# Patient Record
Sex: Male | Born: 1935 | Race: Black or African American | Hispanic: No | Marital: Single | Smoking: Former smoker
Health system: Southern US, Community
[De-identification: ages and names within clinical notes are randomized; demographics above are authoritative.]

## PROBLEM LIST (undated history)

## (undated) DIAGNOSIS — D649 Anemia, unspecified: Secondary | ICD-10-CM

## (undated) DIAGNOSIS — J449 Chronic obstructive pulmonary disease, unspecified: Secondary | ICD-10-CM

## (undated) DIAGNOSIS — I10 Essential (primary) hypertension: Secondary | ICD-10-CM

## (undated) DIAGNOSIS — K5792 Diverticulitis of intestine, part unspecified, without perforation or abscess without bleeding: Secondary | ICD-10-CM

## (undated) DIAGNOSIS — R918 Other nonspecific abnormal finding of lung field: Secondary | ICD-10-CM

## (undated) DIAGNOSIS — F0390 Unspecified dementia without behavioral disturbance: Secondary | ICD-10-CM

## (undated) HISTORY — PX: TONSILLECTOMY: SUR1361

## (undated) HISTORY — PX: SUBTOTAL COLECTOMY: SHX855

---

## 2007-05-13 ENCOUNTER — Inpatient Hospital Stay (HOSPITAL_COMMUNITY): Admission: EM | Admit: 2007-05-13 | Discharge: 2007-05-27 | Payer: Self-pay | Admitting: Emergency Medicine

## 2007-05-13 ENCOUNTER — Ambulatory Visit: Payer: Self-pay | Admitting: Cardiology

## 2007-05-13 ENCOUNTER — Ambulatory Visit: Payer: Self-pay | Admitting: Internal Medicine

## 2007-05-14 ENCOUNTER — Encounter (INDEPENDENT_AMBULATORY_CARE_PROVIDER_SITE_OTHER): Payer: Self-pay | Admitting: Internal Medicine

## 2007-05-20 ENCOUNTER — Encounter (INDEPENDENT_AMBULATORY_CARE_PROVIDER_SITE_OTHER): Payer: Self-pay | Admitting: General Surgery

## 2007-06-27 ENCOUNTER — Encounter: Admission: RE | Admit: 2007-06-27 | Discharge: 2007-06-27 | Payer: Self-pay | Admitting: Thoracic Surgery

## 2007-06-27 ENCOUNTER — Ambulatory Visit: Payer: Self-pay | Admitting: Thoracic Surgery

## 2010-07-31 ENCOUNTER — Encounter: Payer: Self-pay | Admitting: Thoracic Surgery

## 2010-11-22 NOTE — H&P (Signed)
Nathaniel Adkins, Nathaniel Adkins   MEDICAL RECORD NO.:  [id number]          PATIENT TYPE:  INP   LOCATION:  1823                         FACILITY:  [facility]   PHYSICIAN:  Zenaida Deed. Mayford Knife, M.D.DATE OF BIRTH:  [birth year]   DATE OF ADMISSION:  05/13/2007  DATE OF DISCHARGE:                              HISTORY & PHYSICAL   CHIEF COMPLAINT:  Rectal bleeding.   PRIMARY CARE PHYSICIAN:  [facility].   HISTORY OF PRESENT ILLNESS:  This is an 77 year old white male with only  past medical history significant for hypertension.  He reports that he  started 2 days ago with a large bright red bloody stool and filled up  the toilet.  That day, he also had an episode of syncope where he hit  his lip on the counter, but did not lose consciousness.  Since 2 days  ago, he reports that he has had about one-half dozen other similar large  bloody stools.  He also reports emesis x 1 without hematemesis as he has  been unable to keep down any food or drink.  He reports that he has had  to crawl around on the for the last 2 days secondary to severe dizziness  and weakness.  He was finally able to reach the phone today in order to  dial 911.  In addition to the above symptoms, he also endorses a bitter  taste in his mouth as well as pain between his shoulder blades which he  attributes to his gallbladder.  Upon further questioning, the patient  has a long history of intermittent constipation for which he takes stool  softeners as needed.  His usual bowel movement frequency is  approximately every other day.  He also reports that he has had a  history of similar rectal bleeding approximately 10 years ago which he  attributes to food poisoning.  He also reports a long history of dark  tarry stools.  He does not ever recall having a colonoscopy or endoscopy  in the past.   REVIEW OF SYSTEMS:  Please see HPI.  Review of systems is also pertinent  for shortness of  breath, but he denies any chest pain.   PAST MEDICAL HISTORY:  1. Hypertension.  2. Glaucoma.  3. No history of any hospitalizations and his last visit to a      physician was greater than 1 year ago at the [place].   PAST SURGICAL HISTORY:  Tonsillectomy performed in the 1950s.   HOME MEDICATIONS:  1. Hydrochlorothiazide 25 mg once daily.  2. Atenolol 25 mg once daily.  3. Lisinopril 40 mg daily.   SOCIAL HISTORY:  He moved from [place] to [place] 2 years ago.  He currently lives in [place],  [place] and his daughter,  Clarene Essex lives nearby.  He no longer drives and denies any history  of alcohol and tobacco use.   FAMILY HISTORY:  Noncontributory.   PHYSICAL EXAMINATION:  VITAL SIGNS:  Temperature 97, heart rate 115,  blood pressure 129/63, respirations 24, 95% on 4 liters  GENERAL:  The  patient is pleasant in no acute distress, alert and  oriented.  NECK:  No JVD.  HEENT:  Mucous membranes are dry.  CARDIOVASCULAR:  He is tachycardiac with normal S1 and S2 without any  murmur.  LUNGS:  Normal effort.  Clear to auscultation bilaterally.  ABDOMEN:  Bowel sounds are active.  Abdomen is soft, nontender,  nondistended and no masses are palpated.  RECTAL:  There is frank blood on the exterior of the anus.  Sphincter  tone is normal and there are no hemorrhoids evident.  EXTREMITIES:  No edema.   LABORATORY DATA:  Sodium 142, potassium 3.6, chloride 110, bicarb 24,  BUN 48, creatinine 1.54, glucose 177, INR is 1.2, hemoglobin 5.3.   IMPRESSION/PLAN:  This is an 77 year old white male with a history  significant only for hypertension.  He also likely has a long history of  gastrointestinal bleeding given his history of dark tarry stools.  He  presents with a 2 day history of acute bright red blood per rectum.  His  symptoms and labs are consistent with severe anemia.  1. Gastrointestinal:  Given his long history of dark tarry stools,      this may in fact be  an upper GI bleed that is progressing rapidly      such that he now has bright red blood per rectum.  He does not have      any abdominal pain to suggest diverticulitis and no hemorrhoids are      evident on exam.  We will obtain a GI consult for input as well as      evaluation with possible endoscopy and/or colonoscopy.  We will      start him on twice daily Protonix and maintain n.p.o. status.  2. Anemia:  He is currently receiving 1 unit of packed red blood cells      in the emergency department.  We will transfuse an additional 2      units and follow hemoglobin every 8 hours.  Given he is at      increased risk for myocardial infarction due to cardiovascular      stress, we will cycle cardiac enzymes and check an EKG in the      morning.  3. Dehydration:  We will bolus the patient with 1 liter of normal      saline and continue IV fluids at 175 mL per hour.  4. Hypertension:  We will hold the patient's home hypertensive      medications for now.  5. Acute renal failure:  Likely secondary to volume depletion.  We      will follow his creatinine after IV fluid supplementation.      Sylvan Cheese, M.D.  Electronically Signed      Zenaida Deed. Mayford Knife, M.D.  Electronically Signed    MJ/MEDQ  D:  05/13/2007  T:  05/14/2007  Job:  [id number]

## 2010-11-22 NOTE — Letter (Signed)
July 17, 2007   Lorne Skeens. Hoxworth, M.D.  [address], [address]  [place], [place] [address]   Re:  Nathaniel Adkins, Nathaniel Adkins                 DOB:  09/13/1919   Dear Romeo Apple:   I called the patient today because he had cancelled his PET scan.  He  refuses to have his PET scan, says he does not want any more treatment.  My nurse explained to the daughter that this is a very bad situation,  and that he probably has an early lung cancer which we can treat very  easily, and if he waits longer it will be non treatable.  I would  appreciate it if you see him in followup, that you would stress to him  that not continuing with his workup is detrimental to his care.   Ines Bloomer, M.D.  Electronically Signed   DPB/MEDQ  D:  07/17/2007  T:  07/17/2007  Job:  [id number]

## 2010-11-22 NOTE — Discharge Summary (Signed)
Nathaniel Adkins, Nathaniel Adkins               ACCOUNT NO.:  [id number]   MEDICAL RECORD NO.:  [id number]          PATIENT TYPE:  INP   LOCATION:  5531                         FACILITY:  [facility]   PHYSICIAN:  Zenaida Deed. Mayford Knife, M.D.DATE OF BIRTH:  1920/06/10   DATE OF ADMISSION:  05/13/2007  DATE OF DISCHARGE:  05/27/2007                               DISCHARGE SUMMARY   DISCHARGE DIAGNOSES:  1. Lower gastrointestinal bleed.  2. Anemia.  3. Dehydration.  4. Hypertension.  5. Acute renal failure.   CONSULTS:  GI ([facility] R[facility] Evens, M.D.), cardiology Christell Faith,  MD), surgery [facility] M. Derrell Lolling, M.D.), cardiothoracic surgery Ines Bloomer, M.D.).   PROCEDURES/STUDIES:  May 13, 2007 EGD:  Gastrointestinal bleed with  no upper GI source seen on endoscopy.  May 14, 2007 chest x-ray two  view:  Right mid lung zone nodule.  Recommend CT for further assessment.  COPD/emphysema.  May 14, 2007:  2D echo, left ventricular ejection  fraction estimated 65% to 70%.  No diagnostic evidence of left  ventricular regional wall motion abnormality, left ventricular wall  thickeners mildly increased.  Features were consistent with mild  diastolic dysfunction.  Aortic valve was mildly calcified.  There was  mild mitral annular calcification.  The left atrium was mildly dilated.  May 15, 2007 chest CT without contrast:  The nodular opacity  identified in the right mid lung on the recent chest x-ray represents a  soft tissue nodule at the cranial aspect of the right major fissure,  demonstrating subtle peripheral lobulation.  This may well be a small  bronchogenic neoplasm.  The nodule measured 9 mm on CT.  Bilateral  adrenal nodules also noted, but could not further be characterized.  May 15, 2007 colonoscopy:  Lower gastrointestinal bleed, almost  certainly diverticular bleed.  No active bleeding at this time.  May 17, 2007 mesenteric arteriogram:  No evidence of active  extravasation or focal lesion to suggestion a location of the patient's  GI bleed.  May 17, 2007 nuclear medicine GI bleeding scan:  Acute  gastrointestinal bleed localized to the small bowel within the right  upper quadrant and mostly likely the ileum.   DISCHARGE LABS:  White blood cell count 6.6, hemoglobin 8.9, hematocrit  26.3, platelets 300.  Sodium 140, potassium 4.2, chloride 111, bicarb  26, BUN 9, creatinine 1.13, glucose 112, albumin 1.8, calcium 7.9,  magnesium 2.1.   BRIEF HISTORY AND PHYSICAL:  An 77 year old Caucasian male with a past  medical history significant for hypertension and dark tarry stools  presented with a two day history of acute bright red blood per rectum  concerning for diverticular bleed.   HOSPITAL COURSE:  1. Lower gastrointestinal bleed:  The patient presented with a two day      history of nearly half a dozen large bright red bloody stools.      Admit hemoglobin and hematocrit were 5.3 and 15.4 respectively.      EGD at admit did not show signs of an upper GI bleed.  On hospital      day #3, a colonoscopy  was performed per GI recommendations and      demonstrated no active bleeding, but the impression was      diverticular bleed.  Continued episodes of bloody stool and consult      with vascular surgery resulted in mesenteric angiogram on hospital      day #5, which revealed no evidence of acute extravasation or focal      lesion to suggest the location of the bleed.  A nuclear bleeding      scan performed on that day demonstrated an acute GI bleed localized      to the small bowel within the right upper quadrant and mostly      likely the ileum.  However, consensus of GI and vascular surgeons      was that the primary source of the patient's bleed was likely      diverticular.  Intermittent mild bleed continued along with 2 to 4      unit transfusions on average to maintain hemoglobin and hematocrit,      and the patient was taken to OR on  hospital day #75 for a subtotal      colectomy with anastomosis.  There were no signs of small intestine      involvement during the procedure.  The patient tolerated the      procedure very well and had an uncomplicated postoperative course      with gradual return of regular p.o. intake and bowel movements.  No      episodes of rebleed occurred postoperatively.  The patient was      discharged on postoperative day #75  2. Anemia:  Hemoglobin and hematocrit were 5.3 and 15.4 respectively      on admit.  The patient received 17 total units of packed red blood      cells during the hospitalization.  The patient's hemoglobin and      hematocrit were stabilized and maintained as mentioned above.  No      transfusions were needed postoperatively.  Discharge hemoglobin and      hematocrit were 8.9 and 26.3 respectively.  The patient was      discharged on iron sulfate 325 mg p.o. b.i.d.  3. Dehydration:  The patient intravascularly depleted on admit, and      received two one liter boluses on day of admission.  Fluid status      was maintained with normal saline, IV fluids and p.o. intake as      tolerated.  4. Hypertension:  The patient was hypotensive when he presented to the      ED, so home blood pressure regimen was held.  As fluid status      improved and blood pressure returned to hypertensive levels,      metoprolol was gradually restarted.  The patient was discharged on      metoprolol 25 mg p.o. b.i.d. and lisinopril 10 mg one tablet p.o.      daily.  5. Acute renal failure:  The patient's creatinine was 1.54 on admit,      likely elevated secondary to dehydration on admission.  Serum      creatinine returned to 1.16 by hospital day #3 and fluid status was      maintained throughout the remainder of the hospitalization.  Serum      creatinine at discharge was 1.13.  6. Lung mass:  A 5-mm mass detected on admit chest x-ray.  The patient  was without pulmonary symptoms throughout  hospitalization, but has      a distance history of smoking and some residual COPD/emphysema.      Chest CT was performed on hospital day #3.  The nodular opacity in      the right mid lung was read to represent a soft tissue nodule at      the cranial aspect of the right major fissure, demonstrating subtle      peripheral lobulation.  Could not rule out small bronchogenic      neoplasm and recommended possible followup PET CT scan.  Bilateral      adrenal nodules were also noted, and PET CT can assess these      nodules as well if performed.  The patient was seen by      cardiothoracic surgery Ines Bloomer, M.D.) as a consult, and      patient was told to call to schedule a followup appointment at his      clinic for further evaluation and imaging.   DISCHARGE MEDICATIONS AND INSTRUCTIONS:  1. Lisinopril 10 mg one tablet p.o. daily.  2. Metoprolol 25 mg one tablet p.o. b.i.d.  3. Iron sulfate 325 mg p.o. b.i.d.  4. Prilosec 40 mg one tablet p.o. daily.  5. Vicodin 5/325 mg one to two tablets p.o. q.4h. p.r.n. pain (40      dispensed).  6. Home medications stopped:  HCTZ, atenolol.  7. Regular diet with no restriction.   FOLLOW UP:  1. The patient will call to schedule an appointment within two to      three weeks at [facility] with Dr. Johna Sheriff.  2. The patient will schedule appointment by this Friday at [facility]  3. The patient will call to schedule a follow up with Dr. Edwyna Shell to      evaluate lung nodule.   ISSUES FOR FOLLOWUP:  1. Followup patient's hemoglobin and hematocrit, and electrolytes.  2. Followup patient's hemoglobin and hematocrit.  3. Titrate blood pressure medications as necessary and check      appropriate electrolytes.  4. Complete lung nodule workup.  5. Follow surgical wound healing.     ______________________________  Durward Parcel. Mayford Knife, M.D.  Electronically Signed    JS/MEDQ  D:   05/27/2007  T:  05/27/2007  Job:  [id number]

## 2010-11-22 NOTE — Consult Note (Signed)
NAMEMANNIE, OHLIN NO.:  [id number]   MEDICAL RECORD NO.:  [id number]          PATIENT TYPE:  INP   LOCATION:  6729                         FACILITY:  [facility]   PHYSICIAN:  Christell Faith, MD   DATE OF BIRTH:  September 12, 1919   DATE OF CONSULTATION:  05/13/2007  DATE OF DISCHARGE:                                 CONSULTATION   REASON FOR CONSULTATION:  Positive troponin.   DIAGNOSIS:  Gastrointestinal bleed.   RESPONDING PHYSICIAN:  Dr. Charlton Haws with [facility].   CHIEF COMPLAINT:  Blood per rectum and syncope.   HISTORY OF PRESENT ILLNESS:  This is an 77 year old white man admitted  today with a three-day history of bright red blood per rectum.  He also  has a history of dark, tarry stools intermittently for several months  now.  Three days prior to admission, he had several consecutive bloody  stools followed by a syncopal event.  Since then, he has been extremely  weak, unable to walk secondary to lightheadedness, and continues to pass  bloody stools.  He finally presented to the emergency room today where  his hemoglobin was found to be 5.3.  He underwent an upper endoscopy  which was essentially negative.  He is now being transfused PRBCs and  the plan is for colonoscopy within the next day or two.  He continues to  have bloody stools.  His pre-syncope has improved, with no further  syncope.  Apparently on admission, he complained of some upper back pain  between his shoulder blades, but no chest pain, no shortness of breath.  He has no history of coronary disease, MI or congestive heart failure.  His troponin was checked and was found to be 0.6, creatinine kinase was  negative.   PAST MEDICAL HISTORY:  1. Left eye blindness.  This occurred acutely and is thought to      possibly be due to an embolization event.  2. Hypertension.  3. Glaucoma.   SOCIAL HISTORY:  He is a Cytogeneticist.  He lives alone in [place].  He  quit tobacco 30 years  ago.  He is a retired Naval architect.   FAMILY HISTORY:  Noncontributory in an 77 year old.   ALLERGIES:  NO KNOWN DRUG ALLERGIES.   MEDICATIONS:  Home medications:  1. Aspirin 325 mg p.o. daily.  2. Atenolol 25 mg p.o. daily.  3. Lisinopril 40 mg p.o. daily.  4. HCTZ 25 mg p.o. daily.   PHYSICAL EXAMINATION:  VITAL SIGNS:  Temperature of 98.7.  Heart rate  87.  Blood pressure 89/42.  Saturation 100% on two liters.  GENERAL:  This is a pleasant, older gentleman in no distress.  He is  awake, alert and oriented times three.  He has bruising on the left side  of his face and on his left arm secondary to his fall.  HEENT:  His neck veins are flat, no carotid bruits.  Mucous membranes  are moist.  No cervical adenopathy, no thyromegaly.  CARDIAC EXAM:  Regular rhythm, normal rate.  No murmurs or gallops.  RESPIRATORY:  Lungs clear  to auscultation bilaterally without wheezing  or rales.  ABDOMEN:  Soft, nontender, nondistended, normal bowel sounds.  EXTREMITIES:  No edema.  Extremities are positive for pallor, 2+  dorsalis pedis pulses bilaterally, 2+ femoral pulses with a right  femoral bruit.  NEUROLOGIC EXAM:  Nonfocal.  The patient is awake, alert and oriented  times three. No visual acuity left eye.   LABORATORY DATA:  Sodium 142, potassium 3.6, bicarbonate 24, BUN 48,  creatinine 1.5, glucose 177, white blood cells 13.8, hemoglobin 5.3,  platelets 174,000.  CK 145, CK-MB 7.2, troponin 0.67, INR 1.2, PTT 25,  AST 32, ALT 19, total bilirubin 0.8.  Electrocardiogram shows sinus  tachycardia with a rate of 112 with one premature atrial contraction.  There is a left anterior fascicular block and a very mild upsloping ST  depression.   IMPRESSION:  An 77 year old white man with no known cardiac history who  presents with syncope in the setting of a gastrointestinal bleed and  profound anemia.   PLAN:  1. The borderline troponin in this patient almost certainly represents       cardiac strain secondary to profound anemia.  In essence, the      treatment is blood transfusion as you are doing.  Without chest      pain or EKG changes, this troponin is nondiagnostic for acute      coronary syndrome.  In fact, I would only recommend checking      further cardiac enzymes if he develops a clinical picture      consistent with acute coronary syndrome.  2. Agree with continuing to hold aspirin, and would clearly not      anticoagulate this patient.  3. Agree with aggressive blood transfusion and volume resuscitation.      The patient is continuing to bleed and has borderline blood      pressure and would consider transfer to a higher level of care.  4. One possibility is the patient could have a cardiac source of      emboli with recent left eye blindness.  Should this diagnosis end      up being something along the lines of mesenteric ischemia, then      would definitely recommend pursuing heart monitoring to rule out      atrial fibrillation and an echo to r/o thrombus.   Thank you very much for this consult and we will continue to follow  along.      Christell Faith, MD  Electronically Signed     NDL/MEDQ  D:  05/13/2007  T:  05/14/2007  Job:  [id number]

## 2010-11-22 NOTE — Consult Note (Signed)
NAMECHENG, DEC               ACCOUNT NO.:  [id number]   MEDICAL RECORD NO.:  [id number]          PATIENT TYPE:  INP   LOCATION:  3303                         FACILITY:  [facility]   PHYSICIAN:  James L. Malon Kindle., M.D.DATE OF BIRTH:  [birth year]   DATE OF CONSULTATION:  05/13/2007  DATE OF DISCHARGE:                                 CONSULTATION   GASTROENTEROLOGY CONSULTATION:   REQUESTING PHYSICIAN:  Zenaida Deed. Mayford Knife, M.D., Family Practice Teaching  Service   REASON FOR CONSULTATION:  Gastrointestinal bleeding.   HISTORY:  An 77 year old gentleman who has previously gotten his care at  the [place] presented to the emergency room with bright red blood since  Saturday and had a syncopal episode, and his main reason for coming  apparently was syncope.  He had had some vomiting, vague upper abdominal  pain, back pain, noted dark stools ever since rectal bleeding.  He had  rectal bleeding only 1 time before when he had food poisoning many years  ago.  He was reported to be anemic at [facility] where he  went for glaucoma.  No heartburn, indigestion, etcetera.   CURRENT MEDICATIONS:  Daily aspirin, atenolol, hydrochlorothiazide,  lisinopril.  He is not taking any NSAIDs.   ALLERGIES:  NO KNOWN DRUG ALLERGIES.   PAST MEDICAL HISTORY:  Hypertension and glaucoma.  No previous  surgeries.   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   FAMILY HISTORY:  Negative for colon cancer.  He has had a brother with  prostate cancer.   PHYSICAL EXAM:  GENERAL:  The patient is hypotensive, appears slightly  confused, regarding words, quite pale.  HEART:  Regular rate and rhythm without murmurs or gallops.  LUNGS:  Clear.  ABDOMEN:  Soft, nontender, nondistended.  The patient had grossly normal  __________ .   PERTINENT LABS:  Hemoglobin 5.3.   ASSESSMENT:  Acute gastrointestinal bleed, sounds lower.  His  creatinine  is 1.5 and BUN is up at 48.  This could be an upper bleed, and we  probably ought to go ahead with an upper endoscopy first.   PLAN:  We will go ahead with an upper endoscopy today and proceed from  there.           ______________________________  Llana Aliment. Malon Kindle., M.D.     Waldron Session  D:  05/16/2007  T:  05/16/2007  Job:  [id number]

## 2010-11-22 NOTE — Op Note (Signed)
NAMETANVEER, BRAMMER NO.:  [id number]   MEDICAL RECORD NO.:  [id number]          PATIENT TYPE:  INP   LOCATION:  3303                         FACILITY:  [facility]   PHYSICIAN:  Sharlet Salina T. Hoxworth, M.D.DATE OF BIRTH:  1920-06-28   DATE OF PROCEDURE:  05/20/2007  DATE OF DISCHARGE:                               OPERATIVE REPORT   PREOPERATIVE DIAGNOSIS:  Lower GI bleeding, probably secondary to  colonic diverticulosis.   POSTOPERATIVE DIAGNOSIS:  Lower GI bleeding, probably secondary to  colonic diverticulosis.   SURGICAL PROCEDURES:  Subtotal colectomy with anastomosis.   SURGEON:  Lorne Skeens. Hoxworth, M.D.   ASSISTANT:  Cherylynn Ridges, M.D.   ANESTHESIA:  General.   BRIEF HISTORY:  Mr. Radziewicz is an 77 year old male who presents with  persistent lower GI bleeding, intermittent over the last 10 days, but  requiring a total of 17 units of transfusion.  He has had workup  including normal upper endoscopy, lower endoscopy showing pan  diverticulosis with blood throughout the colon.  He had a bleeding scan  that suggested bleeding in the distal small bowel.  Angiography was  negative.  He continues to have episodic significant bleeding and today  hemoglobin fell 2 grams to 7.5 with bright red bloody bowel movements  last night.  Overall, the picture appears most consistent with  diverticular bleed.  After consultation with GI and concurring with the  opinions of my partners following the patient last week, I have  recommended proceeding with a subtotal colectomy in an effort to control  his bleeding.  Nature of procedure, indications, risks of bleeding,  infection, anesthetic risk, cardiopulmonary complications, anastomotic  leak and failure to control his bleeding have been discussed and  understood with the patient and his daughter.  He is now brought to the  operating room for this procedure.   DESCRIPTION OF OPERATION:  The patient is brought to the  operating room,  placed in the supine position on the operating table and general  endotracheal anesthesia was induced.  He received preoperative  antibiotics.  PAS were placed.  The abdomen was widely sterilely prepped  and draped.  Correct patient and procedure were verified.  The abdomen  was explored through a midline incision skirting the umbilicus.  There  was noted to be pan colonic diverticulosis with large ticks, some of  which appeared to contain clot.  There was blood throughout the large  bowel.  The small bowel was carefully examined and there were no  abnormalities detected, specifically no evidence of angiomas, no  palpable masses and there was no blood in the terminal ileum.  This  picture appeared most consistent as thought preop with colonic  diverticular bleed and we elected to proceed with the subtotal  colectomy.  Also noted, the liver was normal.  Stomach and duodenum  appeared normal.  The gallbladder was somewhat distended but no palpable  stones or inflammation.  The colon was then extensively mobilized,  dividing lateral peritoneal attachments along the cecum and right colon,  mobilizing this up out of the retroperitoneum.  The duodenum was  identified and carefully protected.  The omentum was dissected up off  the transverse colon.  The lesser sac entered and the transverse colon  completely mobilized.  There was very redundant sigmoid colon.  The  sigmoid and left colon were mobilized dividing lateral peritoneal  attachments.  Finally, the splenic flexure was taken down using the  Harmonic scalpel and the colon completely mobilized.  At this point  there was noted to be a small capsular tear in the inferior pole of the  spleen with some bleeding.  This was completely controlled with Surgicel  and FloSeal.  The terminal ileum was then divided a few centimeters from  ileocecal valve with the GIA stapler.  The mesentery of the entire colon  was then sequentially  divided with the LigaSure device, although larger  vessels were additionally clamped and tied with 2-0 silk ties.  The  dissection progressed around to the sigmoid and at the distal sigmoid an  area of resection was chosen where the tinea ended and there were no  further ticks seen.  This was at the distal sigmoid colon.  This was  divided with the GIA stapler and the specimen removed.  The abdomen was  again inspected for hemostasis and complete hemostasis was obtained.  Anastomosis was then performed between the terminal ileum and the distal  sigmoid with side-to-side anastomosis using the GIA stapler.  The common  enterotomy was then closed with running 3-0 chromic and seromuscular 2-0  silks.  Abdomen was irrigated and again hemostasis assured.  The  mesenteric defect at the anastomosis was closed with interrupted silks.  The viscera returned to the anatomic position.  The midline fascia was  closed with running #1 PDS begun at either end of the incision and tied  centrally.  The subcu was irrigated and skin closed with staples.  Sponge, needle and instrument counts were correct.  The patient was  taken to the recovery room in stable condition.      Lorne Skeens. Hoxworth, M.D.  Electronically Signed     BTH/MEDQ  D:  05/20/2007  T:  05/21/2007  Job:  [id number]

## 2010-11-22 NOTE — Letter (Signed)
June 27, 2007   Lorne Skeens. Hoxworth, M.D.  [address], [address]  [place], [place] [address]   Re:  HERNDON, GRILL                 DOB:  [birth year]   Dear Romeo Apple,   I saw Mr. Salvato back for followup with his right lower lobe lesion.  Chest x-ray showed no significant change.  He is recovering well after  his subtotal colectomy and says he is doing well.  Apparently he saw you  yesterday.   His lungs are clear to auscultation and percussion.  Heart:  Regular  sinus rhythm.   Since there has been no change and this could possibly be an early lung  cancer, I plan to get a PET scan on it in early January and see him back  when he is further out from his colon surgery.   His blood pressure was 172/76, pulse 67, respirations 18, sats were 98%.   Ines Bloomer, M.D.  Electronically Signed   DPB/MEDQ  D:  06/27/2007  T:  06/28/2007  Job:  [id number]

## 2010-11-22 NOTE — Op Note (Signed)
NAMEKERT, SHACKETT               ACCOUNT NO.:  [id number]   MEDICAL RECORD NO.:  [id number]          PATIENT TYPE:  INP   LOCATION:  1823                         FACILITY:  [facility]   PHYSICIAN:  James L. Malon Kindle., M.D.DATE OF BIRTH:  14-Jan-1920   DATE OF PROCEDURE:  05/13/2007  DATE OF DISCHARGE:                               OPERATIVE REPORT   PROCEDURE:  Esophagogastroduodenoscopy.   MEDICATIONS:  Hurricaine spray, fentanyl 35 mcg, Versed 3.5 mg IV.   INDICATION:  An 77 year old who presented with a subacute GI bleed with  an elevated BUN, a hemoglobin of 5.5.  He has received some transfusion.  This was done in an attempt to identify an upper GI source.   DESCRIPTION OF PROCEDURE:  The procedure was explained to the patient  and consent obtained.  The patient in the left lateral decubitus  position.  The Pentax upper endoscope was inserted and advanced.  The  stomach was entered.  Pylorus identified and passed.  The patient had a  somewhat large stomach, but there was no signs of active bleeding or  clots.  The duodenum was seen well down to the second portion of  ulceration, no sign of current or recent bleeding.  Scope was withdrawn  back in the stomach.  The pyloric channel was normal.  Fundus and cardia  seen on the retroflexed view and was normal.  The scope was withdrawn.  The distal and proximal esophagus revealed no evidence of varices.  Scope withdrawn.  The patient tolerated the procedure well.   ASSESSMENT:  Gastrointestinal bleed with no upper GI source seen on  endoscopy.   PLAN:  Will keep the patient on clear liquids, transfuse and probably do  colonoscopy in the next 1-2 days if the patient is agreeable.           ______________________________  Llana Aliment. Malon Kindle., M.D.     Waldron Session  D:  05/13/2007  T:  05/14/2007  Job:  [id number]   cc:   Raynelle Fanning A. Mayford Knife, M.D.

## 2010-11-22 NOTE — Consult Note (Signed)
NAMEAUTREY, HUMAN NO.:  [id number]   MEDICAL RECORD NO.:  [id number]          PATIENT TYPE:  INP   LOCATION:  3303                         FACILITY:  [facility]   PHYSICIAN:  Angelia Mould. Derrell Lolling, M.D.DATE OF BIRTH:  08/30/1919   DATE OF CONSULTATION:  DATE OF DISCHARGE:                                 CONSULTATION   GASTROENTEROLOGY CONSULT:  Dr. Randa Evens   CONSULTING SURGEON:  Dr. Derrell Lolling   REASON FOR CONSULTATION:  Bright red blood per rectum and possible lower  GI bleed.   HISTORY OF PRESENT ILLNESS:  This is an 77 year old white male with a  history of hypertension, who presented to the ER on November 3 with  rectal bleeding.  He states that he has had this for two days prior to  his presentation to the ER.  At home, he had multiple, large bloody  bowel movements that were all bright red blood.  He had a syncopal  episode at home along with some dizziness and weakness.  On admission,  his hemoglobin was 5.5.  GI was consulted and they performed an  endoscopy which was normal and a colonoscopy which showed large amounts  of old blood and clots and perfuse diverticular disease throughout the  entire colon.  Due to all the old blood, the clots and the diverticular  disease, a definitive bleeding point was not able to be seen.  Since  admission, he has had numerous units of blood transfused.  He is also  Hemoccult-positive.  As of today, he is continuing to have large bloody  bowel movements and; therefore, we were consulted.   REVIEW OF SYSTEMS:  See HPI.  He admits to having bright red blood per  rectum, weakness and kind of trembly and also intermittent abdominal  pain.  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  All other systems  are negative.   PAST MEDICAL HISTORY:  1. Hypertension.  2. Glaucoma.   PAST SURGICAL HISTORY:  He had tonsillectomy in the 1950's.   SOCIAL HISTORY:  He is currently living by himself and moved from  [place] to  [place] to be closer to his daughter.  He does have a  history of smoking, but he quit more than 30 years ago.  He denies any  use of alcohol or other drugs.   PHYSICAL EXAMINATION:  GENERAL:  This is an 77 year old white male who  is pleasant in no acute distress, but is somewhat weak and unsteady,  currently with no fevers or chills.  VITALS:  Temperature 98.3, pulse 71, respirations 18, blood pressure  133/60.  HEENT:  Patient is wearing glasses, otherwise his head is normocephalic,  atraumatic.  Sclerae non-injected.  NECK:  Supple with no lymphadenopathy.  No thyromegaly present.  CHEST:  Clear to auscultation bilaterally with no wheezes, rhonchi or  rales heard.  HEART:  Regular rate and rhythm with no murmurs, gallops or rubs heard.  ABDOMEN:  Soft, distended, nontender with hyperactive bowel sounds.  RECTAL EXAM:  Deferred due to the previous colonoscopy as well as frank  blood  per rectum.  EXTREMITIES:  He is moving all four extremities.  No cyanosis, clubbing  or edema is noted.  NEURO:  He is alert and oriented x3, moving all four extremities with no  focal deficits noted.   LABS AND DIAGNOSTICS:  White blood cell 8,100.  Current hemoglobin is  7.5.  Sodium is 140, potassium 3.4, CO2 26, BUN/creatinine is 21 and  1.16.  Cardiac panel done on November 4 shows a CK of 139, a CK-MB of  4.9 and a troponin of 1.28.   Endoscopy was normal with no upper GI bleed seen.   Colonoscopy shows large amounts of old blood, clots and diffuse pan  diverticular disease with no definitive point of bleeding visualized.   IMPRESSION:  1. Lower GU bleed.  Colonic source likely. Small bowel source possible      but less likely.  2. Acute blood loss anemia which is secondary to his GI bleeding.  3. Hypertension.  4. Acute renal failure which seems to have improved with a current BUN      of 21 and creatinine of 1.16.   PLAN:  1. Immediate red blood cell nuclear medicine scan. We will  follow with      these results.  2. NPO for possibility of surgery.  3. If point of bleeding is determined on the nuclear scan, I would      favor immediate angiography to confirm the site of  bleeding and an      attempt at angioembolization.  4. If no site of bleeding can be demonstrated and he continues to      bleed, we may have to consider a subtotal colectomy.  5. If no site of bleeding is identified and he stops bleeding, will      observe.   Further recommendations will be per Dr. Derrell Lolling after his examination of  the patient.      Letha Cape, PA      Jennings. Derrell Lolling, M.D.  Electronically Signed    KEO/MEDQ  D:  05/17/2007  T:  05/17/2007  Job:  [id number]

## 2010-11-22 NOTE — Op Note (Signed)
Nathaniel Adkins, Nathaniel Adkins               ACCOUNT NO.:  [id number]   MEDICAL RECORD NO.:  [id number]          PATIENT TYPE:  INP   LOCATION:  2852                         FACILITY:  [facility]   PHYSICIAN:  James L. Malon Kindle., M.D.DATE OF BIRTH:  1920-01-12   DATE OF PROCEDURE:  05/15/2007  DATE OF DISCHARGE:                               OPERATIVE REPORT   PROCEDURE:  Colonoscopy.   MEDICATIONS:  1. Fentanyl 50 mcg.  2. Versed 2 mg IV.   INDICATION:  Patient with acute GI bleed with negative upper endoscopy,  had bright red blood during the prep.   DESCRIPTION OF PROCEDURE:  Procedure explained to the patient and  consent obtained.  With the patient in the left lateral decubitus  position, the pediatric Pentax scope was inserted and advanced.  The  patient had a large amount of old blood and clots in the colon, profuse  diverticular disease with multiple large and small diverticula  throughout the entire colon.  We were able to advance over into what was  felt to be the cecum.  There were so many diverticula it was hard to  tell what could have been the appendiceal orifice, and the ileocecal  valve was not clearly seen.  There was no active bleeding there.  The  scope was withdrawn.  The colonic mucosa vigorously irrigated, lots of  old blood and coffee-ground material, no bright red blood and no active  bleeding, profuse diverticulosis throughout.  No real hemorrhoids seen  in the rectum in retroflex view.  The scope was withdrawn.  The patient  tolerated the procedure well, had good vital signs throughout.   ASSESSMENT:  Lower gastrointestinal bleed, almost certainly diverticular  bleed.  No active bleeding at this time.   PLAN:  Will transfer the patient to step-down, keep on clear liquids and  MiraLax.  He would likely need an angiogram with attempted embolization  if he continues to bleed.           ______________________________  Llana Aliment Malon Kindle., M.D.     Nathaniel Adkins   D:  05/15/2007  T:  05/16/2007  Job:  [id number]   cc:   Raynelle Fanning A. Mayford Knife, M.D.

## 2011-04-18 LAB — CROSSMATCH
ABO/RH(D): A POS
ABO/RH(D): A POS
Antibody Screen: NEGATIVE
Antibody Screen: NEGATIVE

## 2011-04-18 LAB — BASIC METABOLIC PANEL
BUN: 8
BUN: 10
BUN: 14
BUN: 42 — ABNORMAL HIGH
BUN: 6
BUN: 9
CO2: 24
CO2: 25
CO2: 25
CO2: 26
CO2: 26
Calcium: 7.7 — ABNORMAL LOW
Calcium: 8 — ABNORMAL LOW
Calcium: 7 — ABNORMAL LOW
Calcium: 7.3 — ABNORMAL LOW
Calcium: 7.4 — ABNORMAL LOW
Calcium: 8.1 — ABNORMAL LOW
Chloride: 109
Chloride: 110
Chloride: 108
Chloride: 109
Chloride: 114 — ABNORMAL HIGH
Creatinine, Ser: 1.04
Creatinine, Ser: 1.17
Creatinine, Ser: 0.97
Creatinine, Ser: 1.07
Creatinine, Ser: 1.13
Creatinine, Ser: 1.16
Creatinine, Ser: 1.27
GFR calc Af Amer: >60
GFR calc Af Amer: >60
GFR calc Af Amer: >60
GFR calc Af Amer: >60
GFR calc Af Amer: >60
GFR calc non Af Amer: 59 — ABNORMAL LOW
GFR calc non Af Amer: 54 — ABNORMAL LOW
GFR calc non Af Amer: 60 — ABNORMAL LOW
GFR calc non Af Amer: >60
GFR calc non Af Amer: >60
GFR calc non Af Amer: >60
GFR calc non Af Amer: >60
Glucose, Bld: 110 — ABNORMAL HIGH
Glucose, Bld: 111 — ABNORMAL HIGH
Glucose, Bld: 122 — ABNORMAL HIGH
Glucose, Bld: 97
Potassium: 3.3 — ABNORMAL LOW
Potassium: 3.2 — ABNORMAL LOW
Potassium: 3.3 — ABNORMAL LOW
Potassium: 3.7
Potassium: 3.7
Potassium: 4.3
Sodium: 138
Sodium: 140
Sodium: 138
Sodium: 140

## 2011-04-18 LAB — ABO/RH: ABO/RH(D): A POS

## 2011-04-18 LAB — COMPREHENSIVE METABOLIC PANEL
ALT: 29
AST: 28
AST: 19
AST: 32
Albumin: 1.8 — ABNORMAL LOW
Albumin: 1.8 — ABNORMAL LOW
Alkaline Phosphatase: 38 — ABNORMAL LOW
BUN: 48 — ABNORMAL HIGH
CO2: 24
Calcium: 7.5 — ABNORMAL LOW
Chloride: 110
Chloride: 112
Creatinine, Ser: 1.13
Creatinine, Ser: 1.1
Creatinine, Ser: 1.54 — ABNORMAL HIGH
GFR calc Af Amer: >60
GFR calc Af Amer: >60
GFR calc non Af Amer: >60
Glucose, Bld: 112 — ABNORMAL HIGH
Glucose, Bld: 177 — ABNORMAL HIGH
Potassium: 3.6
Sodium: 142
Total Bilirubin: 0.6
Total Protein: 4.1 — ABNORMAL LOW
Total Protein: 3.9 — ABNORMAL LOW
Total Protein: 4.5 — ABNORMAL LOW

## 2011-04-18 LAB — CBC
HCT: 15.4 — ABNORMAL LOW
HCT: 21.1 — ABNORMAL LOW
HCT: 21.8 — ABNORMAL LOW
HCT: 22.2 — ABNORMAL LOW
HCT: 23.3 — ABNORMAL LOW
HCT: 24.9 — ABNORMAL LOW
HCT: 26.3 — ABNORMAL LOW
HCT: 26.8 — ABNORMAL LOW
HCT: 27.1 — ABNORMAL LOW
HCT: 32 — ABNORMAL LOW
HCT: 35.7 — ABNORMAL LOW
Hemoglobin: 8.7 — ABNORMAL LOW
Hemoglobin: 10.6 — ABNORMAL LOW
Hemoglobin: 12.2 — ABNORMAL LOW
Hemoglobin: 7.5 — CL
Hemoglobin: 7.9 — CL
Hemoglobin: 8.4 — ABNORMAL LOW
Hemoglobin: 8.6 — ABNORMAL LOW
Hemoglobin: 9 — ABNORMAL LOW
MCHC: 33.3
MCHC: 33.5
MCHC: 33.9
MCHC: 33.8
MCHC: 33.9
MCHC: 33.9
MCHC: 34
MCHC: 34
MCHC: 34
MCHC: 34.1
MCHC: 34.2
MCHC: 34.4
MCHC: 34.9
MCHC: 35
MCV: 88.8
MCV: 90.4
MCV: 82.8
MCV: 86.6
MCV: 86.9
MCV: 87.3
MCV: 87.5
MCV: 89.1
MCV: 89.9
MCV: 90.6
MCV: 90.9
Platelets: 290
Platelets: 300
Platelets: 120 — ABNORMAL LOW
Platelets: 130 — ABNORMAL LOW
Platelets: 134 — ABNORMAL LOW
Platelets: 144 — ABNORMAL LOW
Platelets: 146 — ABNORMAL LOW
Platelets: 163
Platelets: 168
Platelets: 174
Platelets: 198
Platelets: 210
Platelets: 211
Platelets: 85 — ABNORMAL LOW
RBC: 3.07 — ABNORMAL LOW
RBC: 1.78 — ABNORMAL LOW
RBC: 2.42 — ABNORMAL LOW
RBC: 2.51 — ABNORMAL LOW
RBC: 2.67 — ABNORMAL LOW
RBC: 2.67 — ABNORMAL LOW
RBC: 2.92 — ABNORMAL LOW
RBC: 3.04 — ABNORMAL LOW
RBC: 3.06 — ABNORMAL LOW
RDW: 16.3 — ABNORMAL HIGH
RDW: 14.4 — ABNORMAL HIGH
RDW: 15 — ABNORMAL HIGH
RDW: 15.8 — ABNORMAL HIGH
RDW: 16 — ABNORMAL HIGH
RDW: 16.3 — ABNORMAL HIGH
RDW: 16.3 — ABNORMAL HIGH
RDW: 16.5 — ABNORMAL HIGH
RDW: 16.6 — ABNORMAL HIGH
RDW: 16.6 — ABNORMAL HIGH
RDW: 16.8 — ABNORMAL HIGH
RDW: 16.8 — ABNORMAL HIGH
RDW: 17 — ABNORMAL HIGH
WBC: 7.7
WBC: 7.9
WBC: 10.1
WBC: 10.2
WBC: 10.3
WBC: 11.9 — ABNORMAL HIGH
WBC: 8.1
WBC: 8.6
WBC: 8.6
WBC: 9.4
WBC: 9.6

## 2011-04-18 LAB — HEMOGLOBIN AND HEMATOCRIT, BLOOD
HCT: 21.3 — ABNORMAL LOW
HCT: 26 — ABNORMAL LOW
HCT: 28.8 — ABNORMAL LOW
Hemoglobin: 10.2 — ABNORMAL LOW
Hemoglobin: 7.3 — CL
Hemoglobin: 8.1 — ABNORMAL LOW
Hemoglobin: 9 — ABNORMAL LOW
Hemoglobin: 9.3 — ABNORMAL LOW
Hemoglobin: 9.7 — ABNORMAL LOW

## 2011-04-18 LAB — CARDIAC PANEL(CRET KIN+CKTOT+MB+TROPI)
CK, MB: 4.9 — ABNORMAL HIGH
Relative Index: 3.4 — ABNORMAL HIGH
Total CK: 139
Troponin I: 1.28
Troponin I: 1.3

## 2011-04-18 LAB — PREPARE RBC (CROSSMATCH)

## 2011-04-18 LAB — APTT: aPTT: 28

## 2011-04-18 LAB — DIFFERENTIAL
Basophils Absolute: 0
Eosinophils Absolute: 0
Monocytes Relative: 6
Neutrophils Relative %: 82 — ABNORMAL HIGH

## 2011-04-18 LAB — OCCULT BLOOD X 1 CARD TO LAB, STOOL: Fecal Occult Bld: POSITIVE

## 2011-04-18 LAB — TROPONIN I: Troponin I: 0.67

## 2011-04-18 LAB — PROTIME-INR: INR: 1.2

## 2011-04-18 LAB — HEMOGLOBIN: Hemoglobin: 7.5 — CL

## 2011-04-18 LAB — MAGNESIUM: Magnesium: 2.1

## 2011-05-26 ENCOUNTER — Emergency Department (HOSPITAL_COMMUNITY): Payer: Medicare Other

## 2011-05-26 ENCOUNTER — Inpatient Hospital Stay (HOSPITAL_COMMUNITY)
Admission: EM | Admit: 2011-05-26 | Discharge: 2011-06-01 | DRG: 418 | Disposition: A | Discharge status: Home / Self Care | Payer: Medicare Other | Attending: Internal Medicine | Admitting: Internal Medicine

## 2011-05-26 DIAGNOSIS — J449 Chronic obstructive pulmonary disease, unspecified: Secondary | ICD-10-CM | POA: Diagnosis present

## 2011-05-26 DIAGNOSIS — K810 Acute cholecystitis: Secondary | ICD-10-CM | POA: Diagnosis present

## 2011-05-26 DIAGNOSIS — K8040 Calculus of bile duct with cholecystitis, unspecified, without obstruction: Secondary | ICD-10-CM | POA: Diagnosis present

## 2011-05-26 DIAGNOSIS — R509 Fever, unspecified: Secondary | ICD-10-CM | POA: Diagnosis present

## 2011-05-26 DIAGNOSIS — D696 Thrombocytopenia, unspecified: Secondary | ICD-10-CM | POA: Diagnosis present

## 2011-05-26 DIAGNOSIS — I10 Essential (primary) hypertension: Secondary | ICD-10-CM | POA: Diagnosis present

## 2011-05-26 DIAGNOSIS — R222 Localized swelling, mass and lump, trunk: Secondary | ICD-10-CM | POA: Diagnosis present

## 2011-05-26 DIAGNOSIS — K8042 Calculus of bile duct with acute cholecystitis without obstruction: Principal | ICD-10-CM | POA: Diagnosis present

## 2011-05-26 DIAGNOSIS — K8050 Calculus of bile duct without cholangitis or cholecystitis without obstruction: Secondary | ICD-10-CM

## 2011-05-26 DIAGNOSIS — D72829 Elevated white blood cell count, unspecified: Secondary | ICD-10-CM | POA: Diagnosis present

## 2011-05-26 DIAGNOSIS — D649 Anemia, unspecified: Secondary | ICD-10-CM | POA: Diagnosis present

## 2011-05-26 DIAGNOSIS — Z79899 Other long term (current) drug therapy: Secondary | ICD-10-CM

## 2011-05-26 DIAGNOSIS — R17 Unspecified jaundice: Secondary | ICD-10-CM | POA: Diagnosis present

## 2011-05-26 DIAGNOSIS — Z91199 Patient's noncompliance with other medical treatment and regimen due to unspecified reason: Secondary | ICD-10-CM

## 2011-05-26 DIAGNOSIS — E876 Hypokalemia: Secondary | ICD-10-CM | POA: Diagnosis present

## 2011-05-26 DIAGNOSIS — K819 Cholecystitis, unspecified: Secondary | ICD-10-CM

## 2011-05-26 DIAGNOSIS — R1011 Right upper quadrant pain: Secondary | ICD-10-CM | POA: Diagnosis present

## 2011-05-26 DIAGNOSIS — H409 Unspecified glaucoma: Secondary | ICD-10-CM | POA: Diagnosis present

## 2011-05-26 DIAGNOSIS — Z87891 Personal history of nicotine dependence: Secondary | ICD-10-CM

## 2011-05-26 DIAGNOSIS — J4489 Other specified chronic obstructive pulmonary disease: Secondary | ICD-10-CM | POA: Diagnosis present

## 2011-05-26 DIAGNOSIS — Z9119 Patient's noncompliance with other medical treatment and regimen: Secondary | ICD-10-CM

## 2011-05-26 DIAGNOSIS — J90 Pleural effusion, not elsewhere classified: Secondary | ICD-10-CM | POA: Diagnosis present

## 2011-05-26 DIAGNOSIS — N390 Urinary tract infection, site not specified: Secondary | ICD-10-CM | POA: Diagnosis present

## 2011-05-26 DIAGNOSIS — H9190 Unspecified hearing loss, unspecified ear: Secondary | ICD-10-CM | POA: Diagnosis present

## 2011-05-26 HISTORY — DX: Chronic obstructive pulmonary disease, unspecified: J44.9

## 2011-05-26 HISTORY — DX: Other nonspecific abnormal finding of lung field: R91.8

## 2011-05-26 HISTORY — DX: Diverticulitis of intestine, part unspecified, without perforation or abscess without bleeding: K57.92

## 2011-05-26 HISTORY — DX: Anemia, unspecified: D64.9

## 2011-05-26 HISTORY — DX: Essential (primary) hypertension: I10

## 2011-05-26 LAB — URINALYSIS, ROUTINE W REFLEX MICROSCOPIC
Glucose, UA: NEGATIVE mg/dL
Hgb urine dipstick: NEGATIVE
Ketones, ur: 15 mg/dL — AB
Protein, ur: 100 mg/dL — AB

## 2011-05-26 LAB — DIFFERENTIAL
Basophils Relative: 0 % (ref 0–1)
Eosinophils Relative: 0 % (ref 0–5)
Monocytes Absolute: 0.9 10*3/uL (ref 0.1–1.0)
Monocytes Relative: 6 % (ref 3–12)
Neutro Abs: 13.9 10*3/uL — ABNORMAL HIGH (ref 1.7–7.7)

## 2011-05-26 LAB — COMPREHENSIVE METABOLIC PANEL
Albumin: 3.3 g/dL — ABNORMAL LOW (ref 3.5–5.2)
BUN: 24 mg/dL — ABNORMAL HIGH (ref 6–23)
Calcium: 8.9 mg/dL (ref 8.4–10.5)
Chloride: 100 mEq/L (ref 96–112)
Creatinine, Ser: 1.17 mg/dL (ref 0.50–1.35)
GFR calc non Af Amer: 53 mL/min — ABNORMAL LOW (ref >90)
Total Bilirubin: 4.9 mg/dL — ABNORMAL HIGH (ref 0.3–1.2)

## 2011-05-26 LAB — CBC
HCT: 34.4 % — ABNORMAL LOW (ref 39.0–52.0)
Hemoglobin: 11.6 g/dL — ABNORMAL LOW (ref 13.0–17.0)
MCH: 27.2 pg (ref 26.0–34.0)
MCHC: 33.7 g/dL (ref 30.0–36.0)
MCV: 80.6 fL (ref 78.0–100.0)

## 2011-05-26 LAB — URINE MICROSCOPIC-ADD ON

## 2011-05-26 LAB — LIPASE, BLOOD: Lipase: 19 U/L (ref 11–59)

## 2011-05-26 MED ORDER — HYDROMORPHONE HCL PF 1 MG/ML IJ SOLN
0.5000 mg | INTRAMUSCULAR | Status: DC | PRN
  Administered 2011-05-27: 0.5 mg via INTRAVENOUS
  Filled 2011-05-26: qty 1

## 2011-05-26 MED ORDER — ACETAMINOPHEN 325 MG PO TABS
650.0000 mg | ORAL_TABLET | Freq: Four times a day (QID) | ORAL | Status: DC | PRN
  Administered 2011-05-27: 650 mg via ORAL
  Filled 2011-05-26: qty 2

## 2011-05-26 MED ORDER — ONDANSETRON HCL 4 MG PO TABS: 4.0000 mg | ORAL_TABLET | Freq: Four times a day (QID) | ORAL | Status: DC | PRN

## 2011-05-26 MED ORDER — PIPERACILLIN-TAZOBACTAM 3.375 G IVPB
3.3750 g | Freq: Once | INTRAVENOUS | Status: AC
  Administered 2011-05-26: 3.375 g via INTRAVENOUS
  Filled 2011-05-26: qty 50

## 2011-05-26 MED ORDER — DEXTROSE 5 % IV SOLN
1.0000 g | Freq: Once | INTRAVENOUS | Status: AC
  Administered 2011-05-26: 1 g via INTRAVENOUS
  Filled 2011-05-26: qty 10

## 2011-05-26 MED ORDER — POTASSIUM CHLORIDE IN NACL 20-0.9 MEQ/L-% IV SOLN
INTRAVENOUS | Status: DC
  Administered 2011-05-26 – 2011-05-27 (×2): via INTRAVENOUS
  Filled 2011-05-26 (×2): qty 1000

## 2011-05-26 MED ORDER — SODIUM CHLORIDE 0.9 % IV SOLN
INTRAVENOUS | Status: DC
  Administered 2011-05-26 (×2): via INTRAVENOUS

## 2011-05-26 MED ORDER — ACETAMINOPHEN 650 MG RE SUPP: 650.0000 mg | Freq: Four times a day (QID) | RECTAL | Status: DC | PRN

## 2011-05-26 MED ORDER — ONDANSETRON HCL 4 MG/2ML IJ SOLN: 4.0000 mg | Freq: Four times a day (QID) | INTRAMUSCULAR | Status: DC | PRN

## 2011-05-26 MED ORDER — PIPERACILLIN-TAZOBACTAM 3.375 G IVPB
3.3750 g | Freq: Three times a day (TID) | INTRAVENOUS | Status: DC
  Administered 2011-05-27 – 2011-06-01 (×16): 3.375 g via INTRAVENOUS
  Filled 2011-05-26 (×17): qty 50

## 2011-05-26 MED ORDER — PANTOPRAZOLE SODIUM 40 MG IV SOLR
40.0000 mg | Freq: Every day | INTRAVENOUS | Status: DC
  Administered 2011-05-27 – 2011-05-31 (×6): 40 mg via INTRAVENOUS
  Filled 2011-05-26 (×9): qty 40

## 2011-05-26 NOTE — ED Notes (Signed)
Patient is resting comfortably. 

## 2011-05-26 NOTE — ED Notes (Signed)
Family at bedside. 

## 2011-05-26 NOTE — ED Notes (Signed)
Patient presents with right flank pain with radiation to right groin x 3 weeks with worsening pain today. Patient reports he's passed 4 stones in the past 3 weeks and recently has had hematuria.

## 2011-05-26 NOTE — ED Notes (Signed)
Zosyn was hung, then stopped due to receiving orders for blood cultures.

## 2011-05-26 NOTE — ED Provider Notes (Signed)
History     CSN: [id number] Arrival date & time: 05/26/2011 12:55 PM   First MD Initiated Contact with Patient 05/26/11 1330      Chief Complaint  Patient presents with  . Flank Pain    (Consider location/radiation/quality/duration/timing/severity/associated sxs/prior treatment) HPI  Past Medical History  Diagnosis Date  . Diverticulitis   . Hypertension   . COPD (chronic obstructive pulmonary disease)   . Glaucoma   . Lung mass   . Anemia     Past Surgical History  Procedure Date  . Subtotal colectomy     History reviewed. No pertinent family history.  History  Substance Use Topics  . Smoking status: Former Games developer  . Smokeless tobacco: Not on file  . Alcohol Use: No      Review of Systems  Allergies  Review of patient's allergies indicates no known allergies.  Home Medications   Current Outpatient Rx  Name Route Sig Dispense Refill  . ASPIRIN EC 325 MG PO TBEC Oral Take 325 mg by mouth daily as needed. For pain       BP 146/78  Pulse 76  Temp(Src) 100.2 F (37.9 C) (Oral)  Resp 17  SpO2 97%  Physical Exam  ED Course  Procedures (including critical care time)  Labs Reviewed  URINALYSIS, ROUTINE W REFLEX MICROSCOPIC - Abnormal; Notable for the following:    Color, Urine ORANGE (*) BIOCHEMICALS MAY BE AFFECTED BY COLOR   Bilirubin Urine LARGE (*)    Ketones, ur 15 (*)    Protein, ur 100 (*)    Urobilinogen, UA 2.0 (*)    Nitrite POSITIVE (*)    Leukocytes, UA SMALL (*)    All other components within normal limits  CBC - Abnormal; Notable for the following:    WBC 15.4 (*)    Hemoglobin 11.6 (*)    HCT 34.4 (*)    RDW 17.1 (*)    Platelets 120 (*)    All other components within normal limits  DIFFERENTIAL - Abnormal; Notable for the following:    Neutrophils Relative 90 (*)    Neutro Abs 13.9 (*)    Lymphocytes Relative 4 (*)    Lymphs Abs 0.6 (*)    All other components within normal limits  COMPREHENSIVE METABOLIC PANEL -  Abnormal; Notable for the following:    Sodium 134 (*)    Potassium 3.4 (*)    Glucose, Bld 142 (*)    BUN 24 (*)    Albumin 3.3 (*)    AST 78 (*)    ALT 78 (*)    Alkaline Phosphatase 246 (*)    Total Bilirubin 4.9 (*)    GFR calc non Af Amer 53 (*)    GFR calc Af Amer 62 (*)    All other components within normal limits  URINE MICROSCOPIC-ADD ON - Abnormal; Notable for the following:    Bacteria, UA MANY (*)    All other components within normal limits  LIPASE, BLOOD  URINE CULTURE   Ct Abdomen Pelvis Wo Contrast  05/26/2011  **ADDENDUM** CREATED: 05/26/2011 19:01:13  In addition to the gallbladder findings, also noted is intrahepatic/extrahepatic ductal dilatation with choledocholithiasis.  This includes two distal CBD stones measuring approximately 15 mm (series 6/image 32) and 12 mm (series 6/image 35).  **END ADDENDUM** SIGNED BY: Charline Bills, M.D.   05/26/2011  *RADIOLOGY REPORT*  Clinical Data: Right flank pain, history of stones  CT ABDOMEN AND PELVIS WITHOUT CONTRAST  Technique:  Multidetector CT  imaging of the abdomen and pelvis was performed following the standard protocol without intravenous contrast.  Comparison: None.  Findings: Trace bilateral pleural effusions with associated lower lobe atelectasis.  Trace pericardial effusion.  Unenhanced liver and spleen are notable for calcified granulomata. 1.5 cm fluid density lesion in the posterior segment right hepatic lobe (series 6/image 37).  Pancreas and adrenal glands are within normal limits.  Gallbladder is notable for layering sludge and/or small stones (series 6/image 50).  No associated inflammatory changes.  No intrahepatic or extrahepatic ductal dilatation.  Kidneys are unremarkable.  No renal calculi or hydronephrosis.  No evidence of bowel obstruction.  Status post subtotal colectomy with anastomosis in the right lower abdomen (series 6/image 58).  Atherosclerotic calcifications of the abdominal aorta and branch  vessels.  No abdominopelvic ascites.  No suspicious abdominopelvic lymphadenopathy.  Prostate is unremarkable.  No ureteral or bladder calculi.  Degenerative changes of the visualized thoracolumbar spine.  IMPRESSION: No renal, ureteral, or bladder calculi.  No hydronephrosis.  Layering gallbladder sludge/gallstones. No associated inflammatory changes.  Status post subtotal colectomy.  No evidence of bowel obstruction.  Original Report Authenticated By: Charline Bills, M.D.   US Abdomen Complete  05/26/2011  *RADIOLOGY REPORT*  Clinical Data:  Right-sided abdominal and flank pain. Cholelithiasis.  Elevated liver function tests.  ABDOMINAL ULTRASOUND COMPLETE  Comparison:  None.  Findings:  Gallbladder:  Dilated gallbladder seen with layering sludge and several tiny less than 5 mm gallstones. There is mild gallbladder wall thickening measuring up to 5 mm,and minimal pericholecystic fluid.  Although no definite sonographic Murphy's sign was noted by the sonographer, acute cholecystitiscannot be excluded.  Common Bile Duct:  Diffuse dilatation of intra and extrahepatic bile ducts is seen.  The common bile duct measures up to 1.7 cm in diameter.  Multiple stones are seen in the distal common bile duct, largest of which measures approximately 1 cm in diameter.  Liver: No focal mass lesion identified.  Within normal limits in parenchymal echogenicity.  IVC:  Appears normal.  Pancreas:  No abnormality identified.  Spleen:  Within normal limits in size and echotexture.  Right kidney:  Normal in size and parenchymal echogenicity.  No evidence of mass or hydronephrosis.  Left kidney:  Normal in size and parenchymal echogenicity.  No evidence of mass or hydronephrosis.  Abdominal Aorta:  No aneurysm identified.  IMPRESSION:  1. Distended gallbladder with sludge and several tiny gallstones. Mild diffuse gallbladder wall thickening and small amount of pericholecystic fluid also noted; acute cholecystitis cannot be  excluded. 2. Choledocholithiasis, with diffuse intra and extrahepatic biliary dilatation.  Original Report Authenticated By: Danae Orleans, M.D.   Dg Abd Acute W/chest  05/26/2011  *RADIOLOGY REPORT*  Clinical Data: Right-sided abdominal pain.  History of urinary tract calculi.  ACUTE ABDOMEN SERIES (ABDOMEN 2 VIEW & CHEST 1 VIEW) 05/26/2011:  Comparison: CT abdomen and pelvis earlier same date 1449 hours. Unenhanced CT chest 05/15/2007.  Portable chest x-ray 05/14/2007.  Findings: Bowel gas pattern unremarkable without evidence of obstruction or significant ileus.  No evidence of free air or significant air fluid levels on the erect image.  Air-fluid levels in the colon consistent with liquid stool.  Possibly enlarged liver, extending well below the costal margin, with probable Reidel lobe.  No visible opaque urinary tract calculi.  Degenerative changes involving the lumbar spine.  Cardiac silhouette upper normal in size.  Thoracic aorta tortuous and atherosclerotic.  Hilar and mediastinal contours otherwise unremarkable.  Lungs hyperinflated but clear.  IMPRESSION:  1.  No acute abdominal abnormality. 2.  Possible hepatomegaly. 3.  Hyperinflation consistent with COPD and/or asthma.  No acute cardiopulmonary disease.  Original Report Authenticated By: Arnell Sieving, M.D.     1. UTI (lower urinary tract infection)   2. Elevated LFTs   3. Cholelithiasis   4. Cholecystitis       MDM  Ultrasound shows inflamed gallbladder and tiny gallstones.  Patient has an elevation of liver functions. Discussed with general surgery. Admit to general medicine        Donnetta Hutching, MD 05/26/11 2136

## 2011-05-26 NOTE — ED Notes (Signed)
Patient transported to Ultrasound 

## 2011-05-26 NOTE — ED Notes (Signed)
MD cook at bedside

## 2011-05-26 NOTE — ED Notes (Signed)
Family at bedside. Awaiting test result. Reports pain almost gone now. NAD

## 2011-05-26 NOTE — ED Notes (Signed)
Reports has had multiple, intermittent right flank pain radiating into RLQ. I've been passing a lot of stones past 3-4 weeks. +nausea, no vomiting. Reports pain came back 2-3 days ago & noticed hematuria.

## 2011-05-26 NOTE — H&P (Signed)
DATE OF ADMISSION:  05/26/2011  PCP:   No primary provider on file.     Chief Complaint:  RUQ ABD PAIN AND FLANK PAIN  HPI: Nathaniel Adkins is an 77 y.o. male who was brought to the emergency department due to complaints of RUQ abdominal pain and flank pain.   He denies having any nausea or vomiting, but he does report having chronic loose stools and poor appetite with weight loss since his subtotal colectomy 2 years ago for Diverticulitis.  He began to have fevers and chills today and reports seeing red blood in his urine.  He states he does have a history of kidney stones and feels that he has been passing kidney stones this past week.         Past Medical History  Diagnosis Date  . Diverticulitis   . Hypertension   . COPD (chronic obstructive pulmonary disease)   . Glaucoma   . Lung mass   . Anemia        Kidney Stones- Self Reported history  Past Surgical History  Procedure Date  . Subtotal colectomy        Tonsillectomy  Medications:  HOME MEDS: Prior to Admission medications   Medication Sig Start Date End Date Taking? Authorizing Provider  aspirin EC 325 MG tablet Take 325 mg by mouth daily as needed. For pain    Yes Historical Provider, MD    Allergies:  No Known Allergies  Social History:  Widow, lives Alone.    reports that he has quit smoking. He does not have any smokeless tobacco history on file. He reports that he does not drink alcohol or use illicit drugs.  Family History: History reviewed. No pertinent family history.  Review of Systems:  The patient reports anorexia, fever, weight loss, vision loss in Left eye,  ABD Pain and Flank Pain and gross hematuria today.   He denies decreased hearing, hoarseness, chest pain, syncope, dyspnea on exertion, peripheral edema, balance deficits, hemoptysis, melena, hematochezia, severe indigestion/heartburn, incontinence, genital sores, muscle weakness, suspicious skin lesions, difficulty walking, depression, enlarged  lymph nodes, angioedema.  Physical Exam: Filed Vitals:   05/26/11 1547 05/26/11 1800 05/26/11 2017 05/26/11 2106  BP: 165/70 151/62 158/55 146/78  Pulse: 72 70 71 76  Temp:  100.2 F (37.9 C)    TempSrc:  Oral    Resp: 18 17    SpO2: 99% 99% 97% 97%   Blood pressure 146/78, pulse 76, temperature 100.2 F (37.9 C), temperature source Oral, resp. rate 17, SpO2 97.00%.  GEN: Pleasant 77 year old thin elderly Caucasian male in no visible discomfort or acute distress; cooperative with exam PSYCH: He is alert and oriented x4; does not appear anxious does not appear depressed; affect is normal HEENT: Normocephalic and Atraumatic, Mucous membranes pink; PERRLA; EOM intact; Fundi:  Benign;  No scleral icterus, Nares: Patent, Oropharynx: Clear, Edentulous, Neck:  FROM, no cervical lymphadenopathy nor thyromegaly or carotid bruit; no JVD; CHEST WALL: No tenderness CHEST: Normal respiration, clear to auscultation bilaterally HEART: Regular rate and rhythm; no murmurs rubs or gallops BACK: No kyphosis or scoliosis; no CVA tenderness ABDOMEN: Positive Bowel Sounds, Scaphoid, soft non-tender; no masses, no organomegaly. Rectal Exam: Not done EXTREMITIES: No bone or joint deformity; age-appropriate arthropathy of the hands and knees; no edema; no ulcerations. Genitalia: not examined PULSES: 2+ and symmetric SKIN: Normal hydration no rash or ulceration CNS: Cranial nerves 2-12 grossly intact no focal neurologic deficit   Labs & Imaging Results for  orders placed during the hospital encounter of 05/26/11 (from the past 48 hour(s))  URINALYSIS, ROUTINE W REFLEX MICROSCOPIC     Status: Abnormal   Collection Time   05/26/11  1:24 PM      Component Value Range Comment   Color, Urine ORANGE (*) YELLOW  BIOCHEMICALS MAY BE AFFECTED BY COLOR   Appearance CLEAR  CLEAR     Specific Gravity, Urine 1.027  1.005 - 1.030     pH 6.0  5.0 - 8.0     Glucose, UA NEGATIVE  NEGATIVE (mg/dL)    Hgb urine dipstick  NEGATIVE  NEGATIVE     Bilirubin Urine LARGE (*) NEGATIVE     Ketones, ur 15 (*) NEGATIVE (mg/dL)    Protein, ur 130 (*) NEGATIVE (mg/dL)    Urobilinogen, UA 2.0 (*) 0.0 - 1.0 (mg/dL)    Nitrite POSITIVE (*) NEGATIVE     Leukocytes, UA SMALL (*) NEGATIVE    URINE MICROSCOPIC-ADD ON     Status: Abnormal   Collection Time   05/26/11  1:24 PM      Component Value Range Comment   WBC, UA 3-6  <3 (WBC/hpf)    Bacteria, UA MANY (*) RARE     Urine-Other MUCOUS PRESENT     CBC     Status: Abnormal   Collection Time   05/26/11  2:01 PM      Component Value Range Comment   WBC 15.4 (*) 4.0 - 10.5 (K/uL)    RBC 4.27  4.22 - 5.81 (MIL/uL)    Hemoglobin 11.6 (*) 13.0 - 17.0 (g/dL)    HCT 86.5 (*) 78.4 - 52.0 (%)    MCV 80.6  78.0 - 100.0 (fL)    MCH 27.2  26.0 - 34.0 (pg)    MCHC 33.7  30.0 - 36.0 (g/dL)    RDW 69.6 (*) 29.5 - 15.5 (%)    Platelets 120 (*) 150 - 400 (K/uL)   DIFFERENTIAL     Status: Abnormal   Collection Time   05/26/11  2:01 PM      Component Value Range Comment   Neutrophils Relative 90 (*) 43 - 77 (%)    Neutro Abs 13.9 (*) 1.7 - 7.7 (K/uL)    Lymphocytes Relative 4 (*) 12 - 46 (%)    Lymphs Abs 0.6 (*) 0.7 - 4.0 (K/uL)    Monocytes Relative 6  3 - 12 (%)    Monocytes Absolute 0.9  0.1 - 1.0 (K/uL)    Eosinophils Relative 0  0 - 5 (%)    Eosinophils Absolute 0.0  0.0 - 0.7 (K/uL)    Basophils Relative 0  0 - 1 (%)    Basophils Absolute 0.0  0.0 - 0.1 (K/uL)   COMPREHENSIVE METABOLIC PANEL     Status: Abnormal   Collection Time   05/26/11  2:01 PM      Component Value Range Comment   Sodium 134 (*) 135 - 145 (mEq/L)    Potassium 3.4 (*) 3.5 - 5.1 (mEq/L)    Chloride 100  96 - 112 (mEq/L)    CO2 21  19 - 32 (mEq/L)    Glucose, Bld 142 (*) 70 - 99 (mg/dL)    BUN 24 (*) 6 - 23 (mg/dL)    Creatinine, Ser 2.84  0.50 - 1.35 (mg/dL)    Calcium 8.9  8.4 - 10.5 (mg/dL)    Total Protein 6.8  6.0 - 8.3 (g/dL)    Albumin 3.3 (*) 3.5 -  5.2 (g/dL)    AST 78 (*) 0 - 37  (U/L)    ALT 78 (*) 0 - 53 (U/L)    Alkaline Phosphatase 246 (*) 39 - 117 (U/L)    Total Bilirubin 4.9 (*) 0.3 - 1.2 (mg/dL)    GFR calc non Af Amer 53 (*) >90 (mL/min)    GFR calc Af Amer 62 (*) >90 (mL/min)   LIPASE, BLOOD     Status: Normal   Collection Time   05/26/11  2:01 PM      Component Value Range Comment   Lipase 19  11 - 59 (U/L)    Ct Abdomen Pelvis Wo Contrast  05/26/2011  **ADDENDUM** CREATED: 05/26/2011 19:01:13  In addition to the gallbladder findings, also noted is intrahepatic/extrahepatic ductal dilatation with choledocholithiasis.  This includes two distal CBD stones measuring approximately 15 mm (series 6/image 32) and 12 mm (series 6/image 35).  **END ADDENDUM** SIGNED BY: Charline Bills, M.D.   05/26/2011  *RADIOLOGY REPORT*  Clinical Data: Right flank pain, history of stones  CT ABDOMEN AND PELVIS WITHOUT CONTRAST  Technique:  Multidetector CT imaging of the abdomen and pelvis was performed following the standard protocol without intravenous contrast.  Comparison: None.  Findings: Trace bilateral pleural effusions with associated lower lobe atelectasis.  Trace pericardial effusion.  Unenhanced liver and spleen are notable for calcified granulomata. 1.5 cm fluid density lesion in the posterior segment right hepatic lobe (series 6/image 37).  Pancreas and adrenal glands are within normal limits.  Gallbladder is notable for layering sludge and/or small stones (series 6/image 50).  No associated inflammatory changes.  No intrahepatic or extrahepatic ductal dilatation.  Kidneys are unremarkable.  No renal calculi or hydronephrosis.  No evidence of bowel obstruction.  Status post subtotal colectomy with anastomosis in the right lower abdomen (series 6/image 58).  Atherosclerotic calcifications of the abdominal aorta and branch vessels.  No abdominopelvic ascites.  No suspicious abdominopelvic lymphadenopathy.  Prostate is unremarkable.  No ureteral or bladder calculi.   Degenerative changes of the visualized thoracolumbar spine.  IMPRESSION: No renal, ureteral, or bladder calculi.  No hydronephrosis.  Layering gallbladder sludge/gallstones. No associated inflammatory changes.  Status post subtotal colectomy.  No evidence of bowel obstruction.  Original Report Authenticated By: Charline Bills, M.D.   US Abdomen Complete  05/26/2011  *RADIOLOGY REPORT*  Clinical Data:  Right-sided abdominal and flank pain. Cholelithiasis.  Elevated liver function tests.  ABDOMINAL ULTRASOUND COMPLETE  Comparison:  None.  Findings:  Gallbladder:  Dilated gallbladder seen with layering sludge and several tiny less than 5 mm gallstones. There is mild gallbladder wall thickening measuring up to 5 mm,and minimal pericholecystic fluid.  Although no definite sonographic Murphy's sign was noted by the sonographer, acute cholecystitiscannot be excluded.  Common Bile Duct:  Diffuse dilatation of intra and extrahepatic bile ducts is seen.  The common bile duct measures up to 1.7 cm in diameter.  Multiple stones are seen in the distal common bile duct, largest of which measures approximately 1 cm in diameter.  Liver: No focal mass lesion identified.  Within normal limits in parenchymal echogenicity.  IVC:  Appears normal.  Pancreas:  No abnormality identified.  Spleen:  Within normal limits in size and echotexture.  Right kidney:  Normal in size and parenchymal echogenicity.  No evidence of mass or hydronephrosis.  Left kidney:  Normal in size and parenchymal echogenicity.  No evidence of mass or hydronephrosis.  Abdominal Aorta:  No aneurysm identified.  IMPRESSION:  1.  Distended gallbladder with sludge and several tiny gallstones. Mild diffuse gallbladder wall thickening and small amount of pericholecystic fluid also noted; acute cholecystitis cannot be excluded. 2. Choledocholithiasis, with diffuse intra and extrahepatic biliary dilatation.  Original Report Authenticated By: Danae Orleans, M.D.   Dg  Abd Acute W/chest  05/26/2011  *RADIOLOGY REPORT*  Clinical Data: Right-sided abdominal pain.  History of urinary tract calculi.  ACUTE ABDOMEN SERIES (ABDOMEN 2 VIEW & CHEST 1 VIEW) 05/26/2011:  Comparison: CT abdomen and pelvis earlier same date 1449 hours. Unenhanced CT chest 05/15/2007.  Portable chest x-ray 05/14/2007.  Findings: Bowel gas pattern unremarkable without evidence of obstruction or significant ileus.  No evidence of free air or significant air fluid levels on the erect image.  Air-fluid levels in the colon consistent with liquid stool.  Possibly enlarged liver, extending well below the costal margin, with probable Reidel lobe.  No visible opaque urinary tract calculi.  Degenerative changes involving the lumbar spine.  Cardiac silhouette upper normal in size.  Thoracic aorta tortuous and atherosclerotic.  Hilar and mediastinal contours otherwise unremarkable.  Lungs hyperinflated but clear.  IMPRESSION:  1.  No acute abdominal abnormality. 2.  Possible hepatomegaly. 3.  Hyperinflation consistent with COPD and/or asthma.  No acute cardiopulmonary disease.  Original Report Authenticated By: Arnell Sieving, M.D.      Assessment: Present on Admission:  .Acute cholecystitis .RUQ abdominal pain .Fever .Jaundice .Leukocytosis .Hypokalemia .Anemia  UTI  Bilateral Effusions  Lung Mass- Seen by Dr. Edwyna Shell in January 2012.    Plan:          Patient will be admitted to a Med/Surg Bed, and General Surgery has been consulted, Dr. Carolynne Edouard is the consultant on call. The patient has been medicated for pain and is currently pain-free.  Patient has a fever and leukocytosis due to the Acute Cholecystitis and the UTI so blood and urine cultures have been ordered and IV antibiotic therapy of Zosyn has been ordered.  Patient will be NPO except for Ice Chips at this time and IV Fluids have been ordered for maintenance and rehydration therapy.  Potassium repletion has been ordered via the IV Fluid.   And SCDs have been ordered for DVT prophylaxis.   A liver function panel and Lipase level has been ordered for the AM.  Other plans as per orders.    CODE STATUS:      FULL CODE      Nason Conradt C 05/26/2011, 10:03 PM

## 2011-05-26 NOTE — ED Provider Notes (Signed)
History     CSN: [id number] Arrival date & time: 05/26/2011 12:55 PM     Chief Complaint  Patient presents with  . Flank Pain    HPI Pt was seen at 1335.  Per pt and his daughter, c/o gradual onset and worsening of waxing and waning right sided flank pain x3 weeks.  Pt states the pain worsened since yesterday, becoming more constant.  Pain radiates into the right side of his abd, has been assoc with hematuria and nausea.  States "I'm just passing some kidney stones."  Also states he "doesn't have any medical problems so I don't go to a doctor."  Denies CP/SOB, no vomiting/diarrhea, no fevers, no dysuria, no testicular pain/swelling., no rash.      Past Medical History  Diagnosis Date  . Diverticulitis   . Hypertension   . COPD (chronic obstructive pulmonary disease)   . Glaucoma   . Lung mass   . Anemia     Past Surgical History  Procedure Date  . Subtotal colectomy     History  Substance Use Topics  . Smoking status: Former Games developer  . Smokeless tobacco: Not on file  . Alcohol Use: No    Review of Systems ROS: Statement: All systems negative except as marked or noted in the HPI; Constitutional: Negative for fever and chills. ; ; Eyes: Negative for eye pain, redness and discharge. ; ; ENMT: Negative for ear pain, hoarseness, nasal congestion, sinus pressure and sore throat. ; ; Cardiovascular: Negative for chest pain, palpitations, diaphoresis, dyspnea and peripheral edema. ; ; Respiratory: Negative for cough, wheezing and stridor. ; ; Gastrointestinal: +nausea. Negative for vomiting, diarrhea and abdominal pain, blood in stool, hematemesis, jaundice and rectal bleeding. . ; ; Genitourinary: Negative for dysuria, +flank pain and hematuria. Genital:  No penile drainage or rash, no testicular pain or swelling, no scrotal rash or swelling.; Musculoskeletal: Negative for back pain and neck pain. Negative for swelling and trauma.; ; Skin: Negative for pruritus, rash, abrasions,  blisters, bruising and skin lesion.; ; Neuro: Negative for headache, lightheadedness and neck stiffness. Negative for weakness, altered level of consciousness , altered mental status, extremity weakness, paresthesias, involuntary movement, seizure and syncope.     Allergies  Review of patient's allergies indicates no known allergies.  Home Medications   Current Outpatient Rx  Name Route Sig Dispense Refill  . ASPIRIN EC 325 MG PO TBEC Oral Take 325 mg by mouth daily as needed. For pain       BP 152/60  Pulse 84  Temp(Src) 98.2 F (36.8 C) (Oral)  Resp 20  SpO2 98%  Physical Exam 1340: Physical examination:  Nursing notes reviewed; Vital signs and O2 SAT reviewed;  Constitutional: Well developed, Well nourished, Well hydrated, In no acute distress; Head:  Normocephalic, atraumatic; Eyes: EOMI, PERRL, No scleral icterus; ENMT: Mouth and pharynx normal, Mucous membranes moist; Neck: Supple, Full range of motion, No lymphadenopathy; Cardiovascular: Regular rate and rhythm, No murmur, rub, or gallop; Respiratory: Breath sounds clear & equal bilaterally, No rales, rhonchi, wheezes, or rub, Normal respiratory effort/excursion; Chest: Nontender, Movement normal; Abdomen: Soft, Nontender, Nondistended, Normal bowel sounds; Genitourinary: No CVA tenderness,  Spine:  No midline CS, TS, LS tenderness. Extremities: Pulses normal, No tenderness, No edema, No calf edema or asymmetry.; Neuro: AA&Ox3, Major CN grossly intact.  No gross focal motor or sensory deficits in extremities.; Skin: Color normal, Warm, Dry, no rash.    ED Course  Procedures   MDM  MDM Reviewed:  nursing note and vitals Interpretation: labs, x-ray and CT scan   Results for orders placed during the hospital encounter of 05/26/11  URINALYSIS, ROUTINE W REFLEX MICROSCOPIC      Component Value Range   Color, Urine ORANGE (*) YELLOW    Appearance CLEAR  CLEAR    Specific Gravity, Urine 1.027  1.005 - 1.030    pH 6.0  5.0 - 8.0     Glucose, UA NEGATIVE  NEGATIVE (mg/dL)   Hgb urine dipstick NEGATIVE  NEGATIVE    Bilirubin Urine LARGE (*) NEGATIVE    Ketones, ur 15 (*) NEGATIVE (mg/dL)   Protein, ur 161 (*) NEGATIVE (mg/dL)   Urobilinogen, UA 2.0 (*) 0.0 - 1.0 (mg/dL)   Nitrite POSITIVE (*) NEGATIVE    Leukocytes, UA SMALL (*) NEGATIVE   CBC      Component Value Range   WBC 15.4 (*) 4.0 - 10.5 (K/uL)   RBC 4.27  4.22 - 5.81 (MIL/uL)   Hemoglobin 11.6 (*) 13.0 - 17.0 (g/dL)   HCT 09.6 (*) 04.5 - 52.0 (%)   MCV 80.6  78.0 - 100.0 (fL)   MCH 27.2  26.0 - 34.0 (pg)   MCHC 33.7  30.0 - 36.0 (g/dL)   RDW 40.9 (*) 81.1 - 15.5 (%)   Platelets 120 (*) 150 - 400 (K/uL)  DIFFERENTIAL      Component Value Range   Neutrophils Relative 90 (*) 43 - 77 (%)   Neutro Abs 13.9 (*) 1.7 - 7.7 (K/uL)   Lymphocytes Relative 4 (*) 12 - 46 (%)   Lymphs Abs 0.6 (*) 0.7 - 4.0 (K/uL)   Monocytes Relative 6  3 - 12 (%)   Monocytes Absolute 0.9  0.1 - 1.0 (K/uL)   Eosinophils Relative 0  0 - 5 (%)   Eosinophils Absolute 0.0  0.0 - 0.7 (K/uL)   Basophils Relative 0  0 - 1 (%)   Basophils Absolute 0.0  0.0 - 0.1 (K/uL)  COMPREHENSIVE METABOLIC PANEL      Component Value Range   Sodium 134 (*) 135 - 145 (mEq/L)   Potassium 3.4 (*) 3.5 - 5.1 (mEq/L)   Chloride 100  96 - 112 (mEq/L)   CO2 21  19 - 32 (mEq/L)   Glucose, Bld 142 (*) 70 - 99 (mg/dL)   BUN 24 (*) 6 - 23 (mg/dL)   Creatinine, Ser 9.14  0.50 - 1.35 (mg/dL)   Calcium 8.9  8.4 - 78.2 (mg/dL)   Total Protein 6.8  6.0 - 8.3 (g/dL)   Albumin 3.3 (*) 3.5 - 5.2 (g/dL)   AST 78 (*) 0 - 37 (U/L)   ALT 78 (*) 0 - 53 (U/L)   Alkaline Phosphatase 246 (*) 39 - 117 (U/L)   Total Bilirubin 4.9 (*) 0.3 - 1.2 (mg/dL)   GFR calc non Af Amer 53 (*) >90 (mL/min)   GFR calc Af Amer 62 (*) >90 (mL/min)  LIPASE, BLOOD      Component Value Range   Lipase 19  11 - 59 (U/L)  URINE MICROSCOPIC-ADD ON      Component Value Range   WBC, UA 3-6  <3 (WBC/hpf)   Bacteria, UA MANY (*) RARE     Urine-Other MUCOUS PRESENT     Ct Abdomen Pelvis Wo Contrast  05/26/2011  *RADIOLOGY REPORT*  Clinical Data: Right flank pain, history of stones  CT ABDOMEN AND PELVIS WITHOUT CONTRAST  Technique:  Multidetector CT imaging of the abdomen and pelvis was performed  following the standard protocol without intravenous contrast.  Comparison: None.  Findings: Trace bilateral pleural effusions with associated lower lobe atelectasis.  Trace pericardial effusion.  Unenhanced liver and spleen are notable for calcified granulomata. 1.5 cm fluid density lesion in the posterior segment right hepatic lobe (series 6/image 37).  Pancreas and adrenal glands are within normal limits.  Gallbladder is notable for layering sludge and/or small stones (series 6/image 50).  No associated inflammatory changes.  No intrahepatic or extrahepatic ductal dilatation.  Kidneys are unremarkable.  No renal calculi or hydronephrosis.  No evidence of bowel obstruction.  Status post subtotal colectomy with anastomosis in the right lower abdomen (series 6/image 58).  Atherosclerotic calcifications of the abdominal aorta and branch vessels.  No abdominopelvic ascites.  No suspicious abdominopelvic lymphadenopathy.  Prostate is unremarkable.  No ureteral or bladder calculi.  Degenerative changes of the visualized thoracolumbar spine.  IMPRESSION: No renal, ureteral, or bladder calculi.  No hydronephrosis.  Layering gallbladder sludge/gallstones. No associated inflammatory changes.  Status post subtotal colectomy.  No evidence of bowel obstruction.  Original Report Authenticated By: Charline Bills, M.D.   Dg Abd Acute W/chest  05/26/2011  *RADIOLOGY REPORT*  Clinical Data: Right-sided abdominal pain.  History of urinary tract calculi.  ACUTE ABDOMEN SERIES (ABDOMEN 2 VIEW & CHEST 1 VIEW) 05/26/2011:  Comparison: CT abdomen and pelvis earlier same date 1449 hours. Unenhanced CT chest 05/15/2007.  Portable chest x-ray 05/14/2007.  Findings: Bowel  gas pattern unremarkable without evidence of obstruction or significant ileus.  No evidence of free air or significant air fluid levels on the erect image.  Air-fluid levels in the colon consistent with liquid stool.  Possibly enlarged liver, extending well below the costal margin, with probable Reidel lobe.  No visible opaque urinary tract calculi.  Degenerative changes involving the lumbar spine.  Cardiac silhouette upper normal in size.  Thoracic aorta tortuous and atherosclerotic.  Hilar and mediastinal contours otherwise unremarkable.  Lungs hyperinflated but clear.  IMPRESSION:  1.  No acute abdominal abnormality. 2.  Possible hepatomegaly. 3.  Hyperinflation consistent with COPD and/or asthma.  No acute cardiopulmonary disease.  Original Report Authenticated By: Arnell Sieving, M.D.   5:03 PM:  [place] pending re: elevated LFT's.  IV rocephin given for UTI, UC pending.  Concern re: length of time of symptoms, and pt's lack of PMD f/u.  Sign out to Dr. Adriana Simas.          Dmarius Reeder Allison Quarry, DO 05/26/11 1710

## 2011-05-26 NOTE — Consult Note (Signed)
Reason for Consult:right flank pain Referring Physician: Dr. Nyra Market Adkins is an 77 y.o. male.  HPI: the patient is an 77 year old male who has been complaining of right flank pain for about 6 weeks. The pain seems to come and go. He states that he believes he has had kidney stones but he's been passing. He has had some nausea associated with pain. He denies any fevers or chills. He denies any chest pain or shortness of breath. He came to the emergency department where a CT scan was performed. A CT showed some thickening of the gallbladder wall as well as some stones in the gallbladder. Scan also showed stones in the common bile duct with intra-and extrahepatic duct dilatation.  Past Medical History  Diagnosis Date  . Diverticulitis   . Hypertension   . COPD (chronic obstructive pulmonary disease)   . Glaucoma   . Lung mass   . Anemia     Past Surgical History  Procedure Date  . Subtotal colectomy     History reviewed. No pertinent family history.  Social History:  reports that he has quit smoking. He does not have any smokeless tobacco history on file. He reports that he does not drink alcohol or use illicit drugs.  Allergies: No Known Allergies  Medications: I have reviewed the patient's current medications.  Results for orders placed during the hospital encounter of 05/26/11 (from the past 48 hour(s))  URINALYSIS, ROUTINE W REFLEX MICROSCOPIC     Status: Abnormal   Collection Time   05/26/11  1:24 PM      Component Value Range Comment   Color, Urine ORANGE (*) YELLOW  BIOCHEMICALS MAY BE AFFECTED BY COLOR   Appearance CLEAR  CLEAR     Specific Gravity, Urine 1.027  1.005 - 1.030     pH 6.0  5.0 - 8.0     Glucose, UA NEGATIVE  NEGATIVE (mg/dL)    Hgb urine dipstick NEGATIVE  NEGATIVE     Bilirubin Urine LARGE (*) NEGATIVE     Ketones, ur 15 (*) NEGATIVE (mg/dL)    Protein, ur 045 (*) NEGATIVE (mg/dL)    Urobilinogen, UA 2.0 (*) 0.0 - 1.0 (mg/dL)    Nitrite  POSITIVE (*) NEGATIVE     Leukocytes, UA SMALL (*) NEGATIVE    URINE MICROSCOPIC-ADD ON     Status: Abnormal   Collection Time   05/26/11  1:24 PM      Component Value Range Comment   WBC, UA 3-6  <3 (WBC/hpf)    Bacteria, UA MANY (*) RARE     Urine-Other MUCOUS PRESENT     CBC     Status: Abnormal   Collection Time   05/26/11  2:01 PM      Component Value Range Comment   WBC 15.4 (*) 4.0 - 10.5 (K/uL)    RBC 4.27  4.22 - 5.81 (MIL/uL)    Hemoglobin 11.6 (*) 13.0 - 17.0 (g/dL)    HCT 40.9 (*) 81.1 - 52.0 (%)    MCV 80.6  78.0 - 100.0 (fL)    MCH 27.2  26.0 - 34.0 (pg)    MCHC 33.7  30.0 - 36.0 (g/dL)    RDW 91.4 (*) 78.2 - 15.5 (%)    Platelets 120 (*) 150 - 400 (K/uL)   DIFFERENTIAL     Status: Abnormal   Collection Time   05/26/11  2:01 PM      Component Value Range Comment   Neutrophils Relative 90 (*)  43 - 77 (%)    Neutro Abs 13.9 (*) 1.7 - 7.7 (K/uL)    Lymphocytes Relative 4 (*) 12 - 46 (%)    Lymphs Abs 0.6 (*) 0.7 - 4.0 (K/uL)    Monocytes Relative 6  3 - 12 (%)    Monocytes Absolute 0.9  0.1 - 1.0 (K/uL)    Eosinophils Relative 0  0 - 5 (%)    Eosinophils Absolute 0.0  0.0 - 0.7 (K/uL)    Basophils Relative 0  0 - 1 (%)    Basophils Absolute 0.0  0.0 - 0.1 (K/uL)   COMPREHENSIVE METABOLIC PANEL     Status: Abnormal   Collection Time   05/26/11  2:01 PM      Component Value Range Comment   Sodium 134 (*) 135 - 145 (mEq/L)    Potassium 3.4 (*) 3.5 - 5.1 (mEq/L)    Chloride 100  96 - 112 (mEq/L)    CO2 21  19 - 32 (mEq/L)    Glucose, Bld 142 (*) 70 - 99 (mg/dL)    BUN 24 (*) 6 - 23 (mg/dL)    Creatinine, Ser 4.09  0.50 - 1.35 (mg/dL)    Calcium 8.9  8.4 - 10.5 (mg/dL)    Total Protein 6.8  6.0 - 8.3 (g/dL)    Albumin 3.3 (*) 3.5 - 5.2 (g/dL)    AST 78 (*) 0 - 37 (U/L)    ALT 78 (*) 0 - 53 (U/L)    Alkaline Phosphatase 246 (*) 39 - 117 (U/L)    Total Bilirubin 4.9 (*) 0.3 - 1.2 (mg/dL)    GFR calc non Af Amer 53 (*) >90 (mL/min)    GFR calc Af Amer 62 (*)  >90 (mL/min)   LIPASE, BLOOD     Status: Normal   Collection Time   05/26/11  2:01 PM      Component Value Range Comment   Lipase 19  11 - 59 (U/L)     Ct Abdomen Pelvis Wo Contrast  05/26/2011  **ADDENDUM** CREATED: 05/26/2011 19:01:13  In addition to the gallbladder findings, also noted is intrahepatic/extrahepatic ductal dilatation with choledocholithiasis.  This includes two distal CBD stones measuring approximately 15 mm (series 6/image 32) and 12 mm (series 6/image 35).  **END ADDENDUM** SIGNED BY: Nathaniel Adkins, M.D.   05/26/2011  *RADIOLOGY REPORT*  Clinical Data: Right flank pain, history of stones  CT ABDOMEN AND PELVIS WITHOUT CONTRAST  Technique:  Multidetector CT imaging of the abdomen and pelvis was performed following the standard protocol without intravenous contrast.  Comparison: None.  Findings: Trace bilateral pleural effusions with associated lower lobe atelectasis.  Trace pericardial effusion.  Unenhanced liver and spleen are notable for calcified granulomata. 1.5 cm fluid density lesion in the posterior segment right hepatic lobe (series 6/image 37).  Pancreas and adrenal glands are within normal limits.  Gallbladder is notable for layering sludge and/or small stones (series 6/image 50).  No associated inflammatory changes.  No intrahepatic or extrahepatic ductal dilatation.  Kidneys are unremarkable.  No renal calculi or hydronephrosis.  No evidence of bowel obstruction.  Status post subtotal colectomy with anastomosis in the right lower abdomen (series 6/image 58).  Atherosclerotic calcifications of the abdominal aorta and branch vessels.  No abdominopelvic ascites.  No suspicious abdominopelvic lymphadenopathy.  Prostate is unremarkable.  No ureteral or bladder calculi.  Degenerative changes of the visualized thoracolumbar spine.  IMPRESSION: No renal, ureteral, or bladder calculi.  No hydronephrosis.  Layering gallbladder  sludge/gallstones. No associated inflammatory  changes.  Status post subtotal colectomy.  No evidence of bowel obstruction.  Original Report Authenticated By: Nathaniel Adkins, M.D.   US Abdomen Complete  05/26/2011  *RADIOLOGY REPORT*  Clinical Data:  Right-sided abdominal and flank pain. Cholelithiasis.  Elevated liver function tests.  ABDOMINAL ULTRASOUND COMPLETE  Comparison:  None.  Findings:  Gallbladder:  Dilated gallbladder seen with layering sludge and several tiny less than 5 mm gallstones. There is mild gallbladder wall thickening measuring up to 5 mm,and minimal pericholecystic fluid.  Although no definite sonographic Murphy's sign was noted by the sonographer, acute cholecystitiscannot be excluded.  Common Bile Duct:  Diffuse dilatation of intra and extrahepatic bile ducts is seen.  The common bile duct measures up to 1.7 cm in diameter.  Multiple stones are seen in the distal common bile duct, largest of which measures approximately 1 cm in diameter.  Liver: No focal mass lesion identified.  Within normal limits in parenchymal echogenicity.  IVC:  Appears normal.  Pancreas:  No abnormality identified.  Spleen:  Within normal limits in size and echotexture.  Right kidney:  Normal in size and parenchymal echogenicity.  No evidence of mass or hydronephrosis.  Left kidney:  Normal in size and parenchymal echogenicity.  No evidence of mass or hydronephrosis.  Abdominal Aorta:  No aneurysm identified.  IMPRESSION:  1. Distended gallbladder with sludge and several tiny gallstones. Mild diffuse gallbladder wall thickening and small amount of pericholecystic fluid also noted; acute cholecystitis cannot be excluded. 2. Choledocholithiasis, with diffuse intra and extrahepatic biliary dilatation.  Original Report Authenticated By: Danae Orleans, M.D.   Dg Abd Acute W/chest  05/26/2011  *RADIOLOGY REPORT*  Clinical Data: Right-sided abdominal pain.  History of urinary tract calculi.  ACUTE ABDOMEN SERIES (ABDOMEN 2 VIEW & CHEST 1 VIEW) 05/26/2011:   Comparison: CT abdomen and pelvis earlier same date 1449 hours. Unenhanced CT chest 05/15/2007.  Portable chest x-ray 05/14/2007.  Findings: Bowel gas pattern unremarkable without evidence of obstruction or significant ileus.  No evidence of free air or significant air fluid levels on the erect image.  Air-fluid levels in the colon consistent with liquid stool.  Possibly enlarged liver, extending well below the costal margin, with probable Reidel lobe.  No visible opaque urinary tract calculi.  Degenerative changes involving the lumbar spine.  Cardiac silhouette upper normal in size.  Thoracic aorta tortuous and atherosclerotic.  Hilar and mediastinal contours otherwise unremarkable.  Lungs hyperinflated but clear.  IMPRESSION:  1.  No acute abdominal abnormality. 2.  Possible hepatomegaly. 3.  Hyperinflation consistent with COPD and/or asthma.  No acute cardiopulmonary disease.  Original Report Authenticated By: Arnell Sieving, M.D.    @ROS @ Blood pressure 171/61, pulse 68, temperature 97.8 F (36.6 C), temperature source Oral, resp. rate 20, SpO2 97.00%. @PHYSEXAMBYAGE2 @ On exam the patient is elderly in no acute distress. Eyes: Extraocular motions are intact. Sclerae slightly jaundiced. Lungs: Clear bilaterally with the use of the sensory respiratory muscles. Heart: Regular rate and rhythm with an impulse in the left chest. Abdomen: Soft and nontender. No palpable mass. Liver seems slightly enlarged.extremities: No cyanosis clubbing or edema. Psychologically: He is alert and oriented x3 with no evidence today of anxiety or depression Assessment/Plan: Common bile duct stones. I would recommend a gastroenterology consult for possible ERCP. He may require cholecystectomy during this admission if he is otherwise medically stable. We will follow him with you.  TOTH III,Ean Gettel S 05/26/2011, 11:29 PM

## 2011-05-26 NOTE — ED Notes (Signed)
Blood cuiltures completed, zosyn started back iv

## 2011-05-26 NOTE — ED Notes (Signed)
States pain to right flank & RLQ gone but has now moved to LLQ. Refusing pain med when offered

## 2011-05-27 ENCOUNTER — Encounter (HOSPITAL_COMMUNITY): Payer: Self-pay | Admitting: *Deleted

## 2011-05-27 LAB — HEPATIC FUNCTION PANEL
AST: 43 U/L — ABNORMAL HIGH (ref 0–37)
Albumin: 2.6 g/dL — ABNORMAL LOW (ref 3.5–5.2)
Alkaline Phosphatase: 192 U/L — ABNORMAL HIGH (ref 39–117)
Total Bilirubin: 4.5 mg/dL — ABNORMAL HIGH (ref 0.3–1.2)
Total Protein: 5.9 g/dL — ABNORMAL LOW (ref 6.0–8.3)

## 2011-05-27 LAB — URINE CULTURE: Colony Count: 5000

## 2011-05-27 LAB — LIPASE, BLOOD: Lipase: 13 U/L (ref 11–59)

## 2011-05-27 LAB — BASIC METABOLIC PANEL
Chloride: 105 mEq/L (ref 96–112)
GFR calc Af Amer: 57 mL/min — ABNORMAL LOW (ref >90)
GFR calc non Af Amer: 49 mL/min — ABNORMAL LOW (ref >90)
Potassium: 3.3 mEq/L — ABNORMAL LOW (ref 3.5–5.1)

## 2011-05-27 LAB — CBC
HCT: 30.2 % — ABNORMAL LOW (ref 39.0–52.0)
Hemoglobin: 10.2 g/dL — ABNORMAL LOW (ref 13.0–17.0)
MCHC: 33.8 g/dL (ref 30.0–36.0)
RDW: 17.2 % — ABNORMAL HIGH (ref 11.5–15.5)
WBC: 13.1 10*3/uL — ABNORMAL HIGH (ref 4.0–10.5)

## 2011-05-27 MED ORDER — HYDRALAZINE HCL 20 MG/ML IJ SOLN
10.0000 mg | Freq: Four times a day (QID) | INTRAMUSCULAR | Status: DC | PRN
  Administered 2011-05-27 – 2011-05-28 (×2): 10 mg via INTRAVENOUS
  Filled 2011-05-27 (×3): qty 0.5

## 2011-05-27 MED ORDER — LABETALOL HCL 5 MG/ML IV SOLN
10.0000 mg | Freq: Four times a day (QID) | INTRAVENOUS | Status: DC | PRN
  Administered 2011-05-27 – 2011-05-30 (×3): 10 mg via INTRAVENOUS
  Filled 2011-05-27 (×3): qty 4

## 2011-05-27 MED ORDER — POTASSIUM CHLORIDE CRYS ER 20 MEQ PO TBCR
20.0000 meq | EXTENDED_RELEASE_TABLET | Freq: Once | ORAL | Status: AC
  Administered 2011-05-27: 20 meq via ORAL
  Filled 2011-05-27: qty 1

## 2011-05-27 MED ORDER — POTASSIUM CHLORIDE IN NACL 20-0.9 MEQ/L-% IV SOLN
INTRAVENOUS | Status: AC
  Administered 2011-05-27 – 2011-05-28 (×2): via INTRAVENOUS
  Filled 2011-05-27 (×2): qty 1000

## 2011-05-27 NOTE — Progress Notes (Signed)
Subjective: Abdominal pain better per patient.  Reports that he is hard of hearing.  Objective: Vital signs in last 24 hours: Filed Vitals:   05/26/11 2256 05/26/11 2312 05/27/11 0522 05/27/11 1026  BP: 132/54 171/61 151/70 161/66  Pulse: 66 68 60 68  Temp: 99.8 F (37.7 C) 97.8 F (36.6 C) 97.1 F (36.2 C) 98.2 F (36.8 C)  TempSrc: Oral Oral Oral Oral  Resp:  20 20 18  Height:  6' (1.829 m)    Weight:  72.4 kg (159 lb 9.8 oz)    SpO2: 94% 97% 96% 97%   Weight change:  No intake or output data in the 24 hours ending 05/27/11 1404  Physical Exam: General: Awake, Oriented, No acute distress. HEENT: EOMI. Neck: Supple CV: S1 and S2 Lungs: Clear to ascultation bilaterally Abdomen: Soft, Nontender, Nondistended, +bowel sounds. Ext: Good pulses. Trace edema.  Lab Results:  Basename 05/27/11 0630 05/26/11 1401  NA 138 134*  K 3.3* 3.4*  CL 105 100  CO2 24 21  GLUCOSE 103* 142*  BUN 28* 24*  CREATININE 1.25 1.17  CALCIUM 8.2* 8.9  MG -- --  PHOS -- --    Basename 05/27/11 0630 05/26/11 1401  AST 43* 78*  ALT 52 78*  ALKPHOS 192* 246*  BILITOT 4.5* 4.9*  PROT 5.9* 6.8  ALBUMIN 2.6* 3.3*    Basename 05/27/11 0630 05/26/11 1401  LIPASE 13 19  AMYLASE -- --    Basename 05/27/11 0630 05/26/11 1401  WBC 13.1* 15.4*  NEUTROABS -- 13.9*  HGB 10.2* 11.6*  HCT 30.2* 34.4*  MCV 80.5 80.6  PLT 116* 120*   No results found for this basename: CKTOTAL:3,CKMB:3,CKMBINDEX:3,TROPONINI:3 in the last 72 hours No results found for this basename: POCBNP:3 in the last 72 hours No results found for this basename: DDIMER:2 in the last 72 hours No results found for this basename: HGBA1C:2 in the last 72 hours No results found for this basename: CHOL:2,HDL:2,LDLCALC:2,TRIG:2,CHOLHDL:2,LDLDIRECT:2 in the last 72 hours No results found for this basename: TSH,T4TOTAL,FREET3,T3FREE,THYROIDAB in the last 72 hours No results found for this basename:  VITAMINB12:2,FOLATE:2,FERRITIN:2,TIBC:2,IRON:2,RETICCTPCT:2 in the last 72 hours  Micro Results: No results found for this or any previous visit (from the past 240 hour(s)).  Studies/Results: Ct Abdomen Pelvis Wo Contrast  05/26/2011  **ADDENDUM** CREATED: 05/26/2011 19:01:13  In addition to the gallbladder findings, also noted is intrahepatic/extrahepatic ductal dilatation with choledocholithiasis.  This includes two distal CBD stones measuring approximately 15 mm (series 6/image 32) and 12 mm (series 6/image 35).  **END ADDENDUM** SIGNED BY: Sriyesh  Krishnan, M.D.   05/26/2011  *RADIOLOGY REPORT*  Clinical Data: Right flank pain, history of stones  CT ABDOMEN AND PELVIS WITHOUT CONTRAST  Technique:  Multidetector CT imaging of the abdomen and pelvis was performed following the standard protocol without intravenous contrast.  Comparison: None.  Findings: Trace bilateral pleural effusions with associated lower lobe atelectasis.  Trace pericardial effusion.  Unenhanced liver and spleen are notable for calcified granulomata. 1.5 cm fluid density lesion in the posterior segment right hepatic lobe (series 6/image 37).  Pancreas and adrenal glands are within normal limits.  Gallbladder is notable for layering sludge and/or small stones (series 6/image 50).  No associated inflammatory changes.  No intrahepatic or extrahepatic ductal dilatation.  Kidneys are unremarkable.  No renal calculi or hydronephrosis.  No evidence of bowel obstruction.  Status post subtotal colectomy with anastomosis in the right lower abdomen (series 6/image 58).  Atherosclerotic calcifications of the abdominal aorta and   branch vessels.  No abdominopelvic ascites.  No suspicious abdominopelvic lymphadenopathy.  Prostate is unremarkable.  No ureteral or bladder calculi.  Degenerative changes of the visualized thoracolumbar spine.  IMPRESSION: No renal, ureteral, or bladder calculi.  No hydronephrosis.  Layering gallbladder  sludge/gallstones. No associated inflammatory changes.  Status post subtotal colectomy.  No evidence of bowel obstruction.  Original Report Authenticated By: SRIYESH KRISHNAN, M.D.   Us Abdomen Complete  05/26/2011  *RADIOLOGY REPORT*  Clinical Data:  Right-sided abdominal and flank pain. Cholelithiasis.  Elevated liver function tests.  ABDOMINAL ULTRASOUND COMPLETE  Comparison:  None.  Findings:  Gallbladder:  Dilated gallbladder seen with layering sludge and several tiny less than 5 mm gallstones. There is mild gallbladder wall thickening measuring up to 5 mm,and minimal pericholecystic fluid.  Although no definite sonographic Murphy's sign was noted by the sonographer, acute cholecystitiscannot be excluded.  Common Bile Duct:  Diffuse dilatation of intra and extrahepatic bile ducts is seen.  The common bile duct measures up to 1.7 cm in diameter.  Multiple stones are seen in the distal common bile duct, largest of which measures approximately 1 cm in diameter.  Liver: No focal mass lesion identified.  Within normal limits in parenchymal echogenicity.  IVC:  Appears normal.  Pancreas:  No abnormality identified.  Spleen:  Within normal limits in size and echotexture.  Right kidney:  Normal in size and parenchymal echogenicity.  No evidence of mass or hydronephrosis.  Left kidney:  Normal in size and parenchymal echogenicity.  No evidence of mass or hydronephrosis.  Abdominal Aorta:  No aneurysm identified.  IMPRESSION:  1. Distended gallbladder with sludge and several tiny gallstones. Mild diffuse gallbladder wall thickening and small amount of pericholecystic fluid also noted; acute cholecystitis cannot be excluded. 2. Choledocholithiasis, with diffuse intra and extrahepatic biliary dilatation.  Original Report Authenticated By: JOHN A. STAHL, M.D.   Dg Abd Acute W/chest  05/26/2011  *RADIOLOGY REPORT*  Clinical Data: Right-sided abdominal pain.  History of urinary tract calculi.  ACUTE ABDOMEN SERIES  (ABDOMEN 2 VIEW & CHEST 1 VIEW) 05/26/2011:  Comparison: CT abdomen and pelvis earlier same date 1449 hours. Unenhanced CT chest 05/15/2007.  Portable chest x-ray 05/14/2007.  Findings: Bowel gas pattern unremarkable without evidence of obstruction or significant ileus.  No evidence of free air or significant air fluid levels on the erect image.  Air-fluid levels in the colon consistent with liquid stool.  Possibly enlarged liver, extending well below the costal margin, with probable Reidel lobe.  No visible opaque urinary tract calculi.  Degenerative changes involving the lumbar spine.  Cardiac silhouette upper normal in size.  Thoracic aorta tortuous and atherosclerotic.  Hilar and mediastinal contours otherwise unremarkable.  Lungs hyperinflated but clear.  IMPRESSION:  1.  No acute abdominal abnormality. 2.  Possible hepatomegaly. 3.  Hyperinflation consistent with COPD and/or asthma.  No acute cardiopulmonary disease.  Original Report Authenticated By: THOMAS E. LAWRENCE, M.D.    Medications: I have reviewed the patient's current medications. Scheduled Meds:   . cefTRIAXone (ROCEPHIN) IV  1 g Intravenous Once  . pantoprazole (PROTONIX) IV  40 mg Intravenous QHS  . piperacillin-tazobactam (ZOSYN)  IV  3.375 g Intravenous Once  . piperacillin-tazobactam (ZOSYN)  IV  3.375 g Intravenous Q8H  . potassium chloride  20 mEq Oral Once   Continuous Infusions:   . sodium chloride 75 mL/hr at 05/26/11 2155  . 0.9 % NaCl with KCl 20 mEq / L 75 mL/hr at 05/27/11 1150     PRN Meds:.acetaminophen, acetaminophen, HYDROmorphone, ondansetron (ZOFRAN) IV, ondansetron  Assessment/Plan:  1. Acute cholecystitis/RUQ abdominal pain.  Continue Zosyn, antibiotic since 05/26/2011.  General surgery onboard.  GI consult set for consideration of possible ERCP.  2. Fever.  Secondary to #1.  3. Jaundice.  Secondary to #1.  4. Leukocytosis.  Stable.  5. Hypokalemia.  Replace when necessary.  6. Anemia.  Will send  for anemia panel.  7. Thrombocytopenia likely due to #1.  Monitor for now.  8. Prophylax.  SCDs.  9. CODE STATUS.  Full code.   LOS: 1 day  Helina Hullum A, MD 05/27/2011, 2:04 PM 

## 2011-05-27 NOTE — Progress Notes (Addendum)
Patient complaint of abdominal pain, LUQ this morning. Effective with dilaudid. Pain scale 0. BP elevated 190/90, 60, 100.4, 18. Orders received for hydralazine 10 mg IV. Tylenol given for fever. Will monitor. Consent signed for ERCP tomorrow. Daughter Britta Mccreedy at bedside. Provided information on ERCP. Patient's bp rechecked, 180/75, 74 ,100.2. md notified. Order received.

## 2011-05-27 NOTE — Progress Notes (Signed)
Subjective: Feeling better after IV pain medication. Earlier c/o somewhat diffuse abd pain.  Objective: Vital signs in last 24 hours: Temp:  [97.1 F (36.2 C)-100.2 F (37.9 C)] 98.2 F (36.8 C) (11/17 1026) Pulse Rate:  [60-84] 68  (11/17 1026) Resp:  [17-20] 18  (11/17 1026) BP: (132-171)/(54-78) 161/66 mmHg (11/17 1026) SpO2:  [94 %-99 %] 97 % (11/17 1026) Weight:  [72.4 kg (159 lb 9.8 oz)] 159 lb 9.8 oz (72.4 kg) (11/16 2312)    Intake/Output from previous day:   Intake/Output this shift:    General appearance: alert, cooperative, appears stated age and cachectic Resp: clear to auscultation bilaterally Cardio: regular rate and rhythm Skin: Skin color, jaundice noted  Lab Results:   Basename 05/27/11 0630 05/26/11 1401  WBC 13.1* 15.4*  HGB 10.2* 11.6*  HCT 30.2* 34.4*  PLT 116* 120*   BMET  Basename 05/27/11 0630 05/26/11 1401  NA 138 134*  K 3.3* 3.4*  CL 105 100  CO2 24 21  GLUCOSE 103* 142*  BUN 28* 24*  CREATININE 1.25 1.17  CALCIUM 8.2* 8.9   PT/INR No results found for this basename: LABPROT:2,INR:2 in the last 72 hours ABG No results found for this basename: PHART:2,PCO2:2,PO2:2,HCO3:2 in the last 72 hours  Studies/Results: Ct Abdomen Pelvis Wo Contrast  05/26/2011  **ADDENDUM** CREATED: 05/26/2011 19:01:13  In addition to the gallbladder findings, also noted is intrahepatic/extrahepatic ductal dilatation with choledocholithiasis.  This includes two distal CBD stones measuring approximately 15 mm (series 6/image 32) and 12 mm (series 6/image 35).  **END ADDENDUM** SIGNED BY: Charline Bills, M.D.   05/26/2011  *RADIOLOGY REPORT*  Clinical Data: Right flank pain, history of stones  CT ABDOMEN AND PELVIS WITHOUT CONTRAST  Technique:  Multidetector CT imaging of the abdomen and pelvis was performed following the standard protocol without intravenous contrast.  Comparison: None.  Findings: Trace bilateral pleural effusions with associated lower  lobe atelectasis.  Trace pericardial effusion.  Unenhanced liver and spleen are notable for calcified granulomata. 1.5 cm fluid density lesion in the posterior segment right hepatic lobe (series 6/image 37).  Pancreas and adrenal glands are within normal limits.  Gallbladder is notable for layering sludge and/or small stones (series 6/image 50).  No associated inflammatory changes.  No intrahepatic or extrahepatic ductal dilatation.  Kidneys are unremarkable.  No renal calculi or hydronephrosis.  No evidence of bowel obstruction.  Status post subtotal colectomy with anastomosis in the right lower abdomen (series 6/image 58).  Atherosclerotic calcifications of the abdominal aorta and branch vessels.  No abdominopelvic ascites.  No suspicious abdominopelvic lymphadenopathy.  Prostate is unremarkable.  No ureteral or bladder calculi.  Degenerative changes of the visualized thoracolumbar spine.  IMPRESSION: No renal, ureteral, or bladder calculi.  No hydronephrosis.  Layering gallbladder sludge/gallstones. No associated inflammatory changes.  Status post subtotal colectomy.  No evidence of bowel obstruction.  Original Report Authenticated By: Charline Bills, M.D.   US Abdomen Complete  05/26/2011  *RADIOLOGY REPORT*  Clinical Data:  Right-sided abdominal and flank pain. Cholelithiasis.  Elevated liver function tests.  ABDOMINAL ULTRASOUND COMPLETE  Comparison:  None.  Findings:  Gallbladder:  Dilated gallbladder seen with layering sludge and several tiny less than 5 mm gallstones. There is mild gallbladder wall thickening measuring up to 5 mm,and minimal pericholecystic fluid.  Although no definite sonographic Murphy's sign was noted by the sonographer, acute cholecystitiscannot be excluded.  Common Bile Duct:  Diffuse dilatation of intra and extrahepatic bile ducts is seen.  The common  bile duct measures up to 1.7 cm in diameter.  Multiple stones are seen in the distal common bile duct, largest of which measures  approximately 1 cm in diameter.  Liver: No focal mass lesion identified.  Within normal limits in parenchymal echogenicity.  IVC:  Appears normal.  Pancreas:  No abnormality identified.  Spleen:  Within normal limits in size and echotexture.  Right kidney:  Normal in size and parenchymal echogenicity.  No evidence of mass or hydronephrosis.  Left kidney:  Normal in size and parenchymal echogenicity.  No evidence of mass or hydronephrosis.  Abdominal Aorta:  No aneurysm identified.  IMPRESSION:  1. Distended gallbladder with sludge and several tiny gallstones. Mild diffuse gallbladder wall thickening and small amount of pericholecystic fluid also noted; acute cholecystitis cannot be excluded. 2. Choledocholithiasis, with diffuse intra and extrahepatic biliary dilatation.  Original Report Authenticated By: Danae Orleans, M.D.   Dg Abd Acute W/chest  05/26/2011  *RADIOLOGY REPORT*  Clinical Data: Right-sided abdominal pain.  History of urinary tract calculi.  ACUTE ABDOMEN SERIES (ABDOMEN 2 VIEW & CHEST 1 VIEW) 05/26/2011:  Comparison: CT abdomen and pelvis earlier same date 1449 hours. Unenhanced CT chest 05/15/2007.  Portable chest x-ray 05/14/2007.  Findings: Bowel gas pattern unremarkable without evidence of obstruction or significant ileus.  No evidence of free air or significant air fluid levels on the erect image.  Air-fluid levels in the colon consistent with liquid stool.  Possibly enlarged liver, extending well below the costal margin, with probable Reidel lobe.  No visible opaque urinary tract calculi.  Degenerative changes involving the lumbar spine.  Cardiac silhouette upper normal in size.  Thoracic aorta tortuous and atherosclerotic.  Hilar and mediastinal contours otherwise unremarkable.  Lungs hyperinflated but clear.  IMPRESSION:  1.  No acute abdominal abnormality. 2.  Possible hepatomegaly. 3.  Hyperinflation consistent with COPD and/or asthma.  No acute cardiopulmonary disease.  Original Report  Authenticated By: Arnell Sieving, M.D.    Anti-infectives: Anti-infectives     Start     Dose/Rate Route Frequency Ordered Stop   05/26/11 2200   piperacillin-tazobactam (ZOSYN) IVPB 3.375 g        3.375 g 12.5 mL/hr over 240 Minutes Intravenous 3 times per day 05/26/11 2157     05/26/11 2145   piperacillin-tazobactam (ZOSYN) IVPB 3.375 g        3.375 g 12.5 mL/hr over 240 Minutes Intravenous  Once 05/26/11 2133 05/27/11 0155   05/26/11 1715   cefTRIAXone (ROCEPHIN) 1 g in dextrose 5 % 50 mL IVPB        1 g 100 mL/hr over 30 Minutes Intravenous  Once 05/26/11 1711 05/26/11 1846          Assessment/Plan: Patient Active Problem List  Diagnoses  . Acute cholecystitis  . RUQ abdominal pain  . Fever  . Jaundice  . Leukocytosis  . Hypokalemia  . Anemia   PLAN: Recommend GI consult, ERCP likely needed with CBD stones Will follow with you.  LOS: 1 day    [facility] Pager [phone] General Trauma Pager [phone]

## 2011-05-27 NOTE — Consult Note (Signed)
Reason for Consult:Choledocholithiasis Referring Physician: [facility] - Unassigned patient for [facility]  Koi Zangara HPI: This is an 77 year old gentleman with a PMH of subtotal colectomy for diverticulitis, HTN, COPD, lung mass, and renal stones who is admitted to the hospital with complaints of abdominal pain.  His symptoms started two months ago and he reports passing renal stones.  His first stone was very painful, but a few subsequent stones were mild.  He attributed his abdominal pain to the renal stones, but when his last bout of pain was very intense he presented to the ER.  Further evaluation revealed diffuse intra and extra hepatic ductal dilation and choledocholithiasis on the ultrasound.  There is also evidence of sludge in the gallbladder and some thickening in the wall of the gallbladder.  As a result of the findings a surgical and GI consultation was ordered.  Past Medical History  Diagnosis Date  . Diverticulitis   . Hypertension   . COPD (chronic obstructive pulmonary disease)   . Glaucoma   . Lung mass   . Anemia     Past Surgical History  Procedure Date  . Subtotal colectomy     History reviewed. No pertinent family history.  Social History:  reports that he has quit smoking. He does not have any smokeless tobacco history on file. He reports that he does not drink alcohol or use illicit drugs.  Allergies: No Known Allergies  Medications:  Scheduled:   . cefTRIAXone (ROCEPHIN) IV  1 g Intravenous Once  . pantoprazole (PROTONIX) IV  40 mg Intravenous QHS  . piperacillin-tazobactam (ZOSYN)  IV  3.375 g Intravenous Once  . piperacillin-tazobactam (ZOSYN)  IV  3.375 g Intravenous Q8H  . potassium chloride  20 mEq Oral Once   Continuous:   . 0.9 % NaCl with KCl 20 mEq / L    . DISCONTD: sodium chloride 75 mL/hr at 05/26/11 2155  . DISCONTD: 0.9 % NaCl with KCl 20 mEq / L 75 mL/hr at 05/27/11 1150    Results for orders placed during the hospital  encounter of 05/26/11 (from the past 24 hour(s))  BASIC METABOLIC PANEL     Status: Abnormal   Collection Time   05/27/11  6:30 AM      Component Value Range   Sodium 138  135 - 145 (mEq/L)   Potassium 3.3 (*) 3.5 - 5.1 (mEq/L)   Chloride 105  96 - 112 (mEq/L)   CO2 24  19 - 32 (mEq/L)   Glucose, Bld 103 (*) 70 - 99 (mg/dL)   BUN 28 (*) 6 - 23 (mg/dL)   Creatinine, Ser 1.61  0.50 - 1.35 (mg/dL)   Calcium 8.2 (*) 8.4 - 10.5 (mg/dL)   GFR calc non Af Amer 49 (*) >90 (mL/min)   GFR calc Af Amer 57 (*) >90 (mL/min)  CBC     Status: Abnormal   Collection Time   05/27/11  6:30 AM      Component Value Range   WBC 13.1 (*) 4.0 - 10.5 (K/uL)   RBC 3.75 (*) 4.22 - 5.81 (MIL/uL)   Hemoglobin 10.2 (*) 13.0 - 17.0 (g/dL)   HCT 09.6 (*) 04.5 - 52.0 (%)   MCV 80.5  78.0 - 100.0 (fL)   MCH 27.2  26.0 - 34.0 (pg)   MCHC 33.8  30.0 - 36.0 (g/dL)   RDW 40.9 (*) 81.1 - 15.5 (%)   Platelets 116 (*) 150 - 400 (K/uL)  HEPATIC FUNCTION PANEL  Status: Abnormal   Collection Time   05/27/11  6:30 AM      Component Value Range   Total Protein 5.9 (*) 6.0 - 8.3 (g/dL)   Albumin 2.6 (*) 3.5 - 5.2 (g/dL)   AST 43 (*) 0 - 37 (U/L)   ALT 52  0 - 53 (U/L)   Alkaline Phosphatase 192 (*) 39 - 117 (U/L)   Total Bilirubin 4.5 (*) 0.3 - 1.2 (mg/dL)   Bilirubin, Direct 3.8 (*) 0.0 - 0.3 (mg/dL)   Indirect Bilirubin 0.7  0.3 - 0.9 (mg/dL)  LIPASE, BLOOD     Status: Normal   Collection Time   05/27/11  6:30 AM      Component Value Range   Lipase 13  11 - 59 (U/L)     Ct Abdomen Pelvis Wo Contrast  05/26/2011  **ADDENDUM** CREATED: 05/26/2011 19:01:13  In addition to the gallbladder findings, also noted is intrahepatic/extrahepatic ductal dilatation with choledocholithiasis.  This includes two distal CBD stones measuring approximately 15 mm (series 6/image 32) and 12 mm (series 6/image 35).  **END ADDENDUM** SIGNED BY: Charline Bills, M.D.   05/26/2011  *RADIOLOGY REPORT*  Clinical Data: Right flank  pain, history of stones  CT ABDOMEN AND PELVIS WITHOUT CONTRAST  Technique:  Multidetector CT imaging of the abdomen and pelvis was performed following the standard protocol without intravenous contrast.  Comparison: None.  Findings: Trace bilateral pleural effusions with associated lower lobe atelectasis.  Trace pericardial effusion.  Unenhanced liver and spleen are notable for calcified granulomata. 1.5 cm fluid density lesion in the posterior segment right hepatic lobe (series 6/image 37).  Pancreas and adrenal glands are within normal limits.  Gallbladder is notable for layering sludge and/or small stones (series 6/image 50).  No associated inflammatory changes.  No intrahepatic or extrahepatic ductal dilatation.  Kidneys are unremarkable.  No renal calculi or hydronephrosis.  No evidence of bowel obstruction.  Status post subtotal colectomy with anastomosis in the right lower abdomen (series 6/image 58).  Atherosclerotic calcifications of the abdominal aorta and branch vessels.  No abdominopelvic ascites.  No suspicious abdominopelvic lymphadenopathy.  Prostate is unremarkable.  No ureteral or bladder calculi.  Degenerative changes of the visualized thoracolumbar spine.  IMPRESSION: No renal, ureteral, or bladder calculi.  No hydronephrosis.  Layering gallbladder sludge/gallstones. No associated inflammatory changes.  Status post subtotal colectomy.  No evidence of bowel obstruction.  Original Report Authenticated By: Charline Bills, M.D.   US Abdomen Complete  05/26/2011  *RADIOLOGY REPORT*  Clinical Data:  Right-sided abdominal and flank pain. Cholelithiasis.  Elevated liver function tests.  ABDOMINAL ULTRASOUND COMPLETE  Comparison:  None.  Findings:  Gallbladder:  Dilated gallbladder seen with layering sludge and several tiny less than 5 mm gallstones. There is mild gallbladder wall thickening measuring up to 5 mm,and minimal pericholecystic fluid.  Although no definite sonographic Murphy's sign was  noted by the sonographer, acute cholecystitiscannot be excluded.  Common Bile Duct:  Diffuse dilatation of intra and extrahepatic bile ducts is seen.  The common bile duct measures up to 1.7 cm in diameter.  Multiple stones are seen in the distal common bile duct, largest of which measures approximately 1 cm in diameter.  Liver: No focal mass lesion identified.  Within normal limits in parenchymal echogenicity.  IVC:  Appears normal.  Pancreas:  No abnormality identified.  Spleen:  Within normal limits in size and echotexture.  Right kidney:  Normal in size and parenchymal echogenicity.  No evidence of mass or  hydronephrosis.  Left kidney:  Normal in size and parenchymal echogenicity.  No evidence of mass or hydronephrosis.  Abdominal Aorta:  No aneurysm identified.  IMPRESSION:  1. Distended gallbladder with sludge and several tiny gallstones. Mild diffuse gallbladder wall thickening and small amount of pericholecystic fluid also noted; acute cholecystitis cannot be excluded. 2. Choledocholithiasis, with diffuse intra and extrahepatic biliary dilatation.  Original Report Authenticated By: Danae Orleans, M.D.   Dg Abd Acute W/chest  05/26/2011  *RADIOLOGY REPORT*  Clinical Data: Right-sided abdominal pain.  History of urinary tract calculi.  ACUTE ABDOMEN SERIES (ABDOMEN 2 VIEW & CHEST 1 VIEW) 05/26/2011:  Comparison: CT abdomen and pelvis earlier same date 1449 hours. Unenhanced CT chest 05/15/2007.  Portable chest x-ray 05/14/2007.  Findings: Bowel gas pattern unremarkable without evidence of obstruction or significant ileus.  No evidence of free air or significant air fluid levels on the erect image.  Air-fluid levels in the colon consistent with liquid stool.  Possibly enlarged liver, extending well below the costal margin, with probable Reidel lobe.  No visible opaque urinary tract calculi.  Degenerative changes involving the lumbar spine.  Cardiac silhouette upper normal in size.  Thoracic aorta tortuous  and atherosclerotic.  Hilar and mediastinal contours otherwise unremarkable.  Lungs hyperinflated but clear.  IMPRESSION:  1.  No acute abdominal abnormality. 2.  Possible hepatomegaly. 3.  Hyperinflation consistent with COPD and/or asthma.  No acute cardiopulmonary disease.  Original Report Authenticated By: Arnell Sieving, M.D.    ROS:  As stated above in the HPI otherwise negative.  Blood pressure 178/76, pulse 63, temperature 98.2 F (36.8 C), temperature source Oral, resp. rate 20, height 6' (1.829 m), weight 72.4 kg (159 lb 9.8 oz), SpO2 98.00%.    PE: Gen: NAD, Alert and Oriented HEENT:  [place]/AT, EOMI Neck: Supple, no LAD Lungs: CTA Bilaterally CV: RRR without M/G/R ABM: Soft, NTND, +BS Ext: No C/C/E  Assessment/Plan: 1) Choledocholithiasis. 2) Cholelithiasis.   Currently the patient is stable.  No significant abdominal pain with palpation.  His liver profile is consistent with an obstructive etiology.  I will plan for an ERCP tomorrow.  He appears to be in stable condition.  No complaints of SOB or chest pain.  No recent history of MI.  He is afebrile and on Zosyn.  Plan: 1) ERCP tomorrow. 2) Continue with Zosyn.  Kosisochukwu Goldberg D 05/27/2011, 2:46 PM

## 2011-05-28 ENCOUNTER — Encounter (HOSPITAL_COMMUNITY)
Admission: EM | Disposition: A | Discharge status: Home / Self Care | Payer: Self-pay | Source: Home / Self Care | Attending: Internal Medicine

## 2011-05-28 ENCOUNTER — Encounter (HOSPITAL_COMMUNITY): Payer: Self-pay | Admitting: Gastroenterology

## 2011-05-28 ENCOUNTER — Inpatient Hospital Stay (HOSPITAL_COMMUNITY): Payer: Medicare Other

## 2011-05-28 HISTORY — PX: ERCP: SHX5425

## 2011-05-28 LAB — COMPREHENSIVE METABOLIC PANEL
ALT: 37 U/L (ref 0–53)
AST: 27 U/L (ref 0–37)
CO2: 22 mEq/L (ref 19–32)
Calcium: 8.5 mg/dL (ref 8.4–10.5)
Chloride: 107 mEq/L (ref 96–112)
GFR calc Af Amer: 62 mL/min — ABNORMAL LOW (ref >90)
GFR calc non Af Amer: 54 mL/min — ABNORMAL LOW (ref >90)
Glucose, Bld: 101 mg/dL — ABNORMAL HIGH (ref 70–99)
Sodium: 140 mEq/L (ref 135–145)
Total Bilirubin: 2 mg/dL — ABNORMAL HIGH (ref 0.3–1.2)

## 2011-05-28 LAB — CBC
MCH: 26.2 pg (ref 26.0–34.0)
MCHC: 32.4 g/dL (ref 30.0–36.0)
Platelets: 129 10*3/uL — ABNORMAL LOW (ref 150–400)
RDW: 17.1 % — ABNORMAL HIGH (ref 11.5–15.5)

## 2011-05-28 LAB — VITAMIN B12: Vitamin B-12: 383 pg/mL (ref 211–911)

## 2011-05-28 LAB — FOLATE: Folate: 11.4 ng/mL

## 2011-05-28 LAB — RETICULOCYTES
RBC.: 4.05 MIL/uL — ABNORMAL LOW (ref 4.22–5.81)
Retic Count, Absolute: 40.5 10*3/uL (ref 19.0–186.0)
Retic Ct Pct: 1 % (ref 0.4–3.1)

## 2011-05-28 SURGERY — ERCP, WITH INTERVENTION IF INDICATED
Anesthesia: Moderate Sedation

## 2011-05-28 MED ORDER — DIPHENHYDRAMINE HCL 50 MG/ML IJ SOLN
INTRAMUSCULAR | Status: AC
  Filled 2011-05-28: qty 1

## 2011-05-28 MED ORDER — MIDAZOLAM HCL 10 MG/2ML IJ SOLN
INTRAMUSCULAR | Status: DC | PRN
  Administered 2011-05-28 (×2): 2 mg via INTRAVENOUS
  Administered 2011-05-28: 1 mg via INTRAVENOUS

## 2011-05-28 MED ORDER — GLUCAGON HCL (RDNA) 1 MG IJ SOLR
INTRAMUSCULAR | Status: AC
  Filled 2011-05-28: qty 2

## 2011-05-28 MED ORDER — FENTANYL CITRATE 0.05 MG/ML IJ SOLN
INTRAMUSCULAR | Status: AC
  Filled 2011-05-28: qty 4

## 2011-05-28 MED ORDER — FENTANYL CITRATE 0.05 MG/ML IJ SOLN
INTRAMUSCULAR | Status: DC | PRN
  Administered 2011-05-28 (×2): 25 ug via INTRAVENOUS
  Administered 2011-05-28: 12.5 ug via INTRAVENOUS

## 2011-05-28 MED ORDER — POTASSIUM CHLORIDE CRYS ER 20 MEQ PO TBCR
40.0000 meq | EXTENDED_RELEASE_TABLET | Freq: Once | ORAL | Status: AC
  Administered 2011-05-28: 40 meq via ORAL
  Filled 2011-05-28: qty 2

## 2011-05-28 MED ORDER — SODIUM CHLORIDE 0.9 % IJ SOLN
INTRAMUSCULAR | Status: DC | PRN
  Administered 2011-05-28: 11:00:00 via INTRAMUSCULAR

## 2011-05-28 MED ORDER — GLUCAGON HCL (RDNA) 1 MG IJ SOLR
INTRAMUSCULAR | Status: DC | PRN
  Administered 2011-05-28: .5 mg via INTRAVENOUS

## 2011-05-28 MED ORDER — SODIUM CHLORIDE 0.9 % IV SOLN
INTRAVENOUS | Status: DC
  Administered 2011-05-28 – 2011-05-30 (×4): via INTRAVENOUS

## 2011-05-28 MED ORDER — MIDAZOLAM HCL 10 MG/2ML IJ SOLN
INTRAMUSCULAR | Status: AC
  Filled 2011-05-28: qty 4

## 2011-05-28 MED ORDER — BUTAMBEN-TETRACAINE-BENZOCAINE 2-2-14 % EX AERO
INHALATION_SPRAY | CUTANEOUS | Status: DC | PRN
  Administered 2011-05-28: 2 via TOPICAL

## 2011-05-28 NOTE — H&P (View-Only) (Signed)
Subjective: Abdominal pain better per patient.  Reports that he is hard of hearing.  Objective: Vital signs in last 24 hours: Filed Vitals:   05/26/11 2256 05/26/11 2312 05/27/11 0522 05/27/11 1026  BP: 132/54 171/61 151/70 161/66  Pulse: 66 68 60 68  Temp: 99.8 F (37.7 C) 97.8 F (36.6 C) 97.1 F (36.2 C) 98.2 F (36.8 C)  TempSrc: Oral Oral Oral Oral  Resp:  20 20 18   Height:  6' (1.829 m)    Weight:  72.4 kg (159 lb 9.8 oz)    SpO2: 94% 97% 96% 97%   Weight change:  No intake or output data in the 24 hours ending 05/27/11 1404  Physical Exam: General: Awake, Oriented, No acute distress. HEENT: EOMI. Neck: Supple CV: S1 and S2 Lungs: Clear to ascultation bilaterally Abdomen: Soft, Nontender, Nondistended, +bowel sounds. Ext: Good pulses. Trace edema.  Lab Results:  [facility] 05/27/11 0630 05/26/11 1401  NA 138 134*  K 3.3* 3.4*  CL 105 100  CO2 24 21  GLUCOSE 103* 142*  BUN 28* 24*  CREATININE 1.25 1.17  CALCIUM 8.2* 8.9  MG -- --  PHOS -- --    Basename 05/27/11 0630 05/26/11 1401  AST 43* 78*  ALT 52 78*  ALKPHOS 192* 246*  BILITOT 4.5* 4.9*  PROT 5.9* 6.8  ALBUMIN 2.6* 3.3*    Basename 05/27/11 0630 05/26/11 1401  LIPASE 13 19  AMYLASE -- --    Basename 05/27/11 0630 05/26/11 1401  WBC 13.1* 15.4*  NEUTROABS -- 13.9*  HGB 10.2* 11.6*  HCT 30.2* 34.4*  MCV 80.5 80.6  PLT 116* 120*   No results found for this basename: CKTOTAL:3,CKMB:3,CKMBINDEX:3,TROPONINI:3 in the last 72 hours No results found for this basename: POCBNP:3 in the last 72 hours No results found for this basename: DDIMER:2 in the last 72 hours No results found for this basename: HGBA1C:2 in the last 72 hours No results found for this basename: CHOL:2,HDL:2,LDLCALC:2,TRIG:2,CHOLHDL:2,LDLDIRECT:2 in the last 72 hours No results found for this basename: TSH,T4TOTAL,FREET3,T3FREE,THYROIDAB in the last 72 hours No results found for this basename:  VITAMINB12:2,FOLATE:2,FERRITIN:2,TIBC:2,IRON:2,RETICCTPCT:2 in the last 72 hours  Micro Results: No results found for this or any previous visit (from the past 240 hour(s)).  Studies/Results: Ct Abdomen Pelvis Wo Contrast  05/26/2011  **ADDENDUM** CREATED: 05/26/2011 19:01:13  In addition to the gallbladder findings, also noted is intrahepatic/extrahepatic ductal dilatation with choledocholithiasis.  This includes two distal CBD stones measuring approximately 15 mm (series 6/image 32) and 12 mm (series 6/image 35).  **END ADDENDUM** SIGNED BY: Charline Bills, M.D.   05/26/2011  *RADIOLOGY REPORT*  Clinical Data: Right flank pain, history of stones  CT ABDOMEN AND PELVIS WITHOUT CONTRAST  Technique:  Multidetector CT imaging of the abdomen and pelvis was performed following the standard protocol without intravenous contrast.  Comparison: None.  Findings: Trace bilateral pleural effusions with associated lower lobe atelectasis.  Trace pericardial effusion.  Unenhanced liver and spleen are notable for calcified granulomata. 1.5 cm fluid density lesion in the posterior segment right hepatic lobe (series 6/image 37).  Pancreas and adrenal glands are within normal limits.  Gallbladder is notable for layering sludge and/or small stones (series 6/image 50).  No associated inflammatory changes.  No intrahepatic or extrahepatic ductal dilatation.  Kidneys are unremarkable.  No renal calculi or hydronephrosis.  No evidence of bowel obstruction.  Status post subtotal colectomy with anastomosis in the right lower abdomen (series 6/image 58).  Atherosclerotic calcifications of the abdominal aorta and  branch vessels.  No abdominopelvic ascites.  No suspicious abdominopelvic lymphadenopathy.  Prostate is unremarkable.  No ureteral or bladder calculi.  Degenerative changes of the visualized thoracolumbar spine.  IMPRESSION: No renal, ureteral, or bladder calculi.  No hydronephrosis.  Layering gallbladder  sludge/gallstones. No associated inflammatory changes.  Status post subtotal colectomy.  No evidence of bowel obstruction.  Original Report Authenticated By: Charline Bills, M.D.   US Abdomen Complete  05/26/2011  *RADIOLOGY REPORT*  Clinical Data:  Right-sided abdominal and flank pain. Cholelithiasis.  Elevated liver function tests.  ABDOMINAL ULTRASOUND COMPLETE  Comparison:  None.  Findings:  Gallbladder:  Dilated gallbladder seen with layering sludge and several tiny less than 5 mm gallstones. There is mild gallbladder wall thickening measuring up to 5 mm,and minimal pericholecystic fluid.  Although no definite sonographic Murphy's sign was noted by the sonographer, acute cholecystitiscannot be excluded.  Common Bile Duct:  Diffuse dilatation of intra and extrahepatic bile ducts is seen.  The common bile duct measures up to 1.7 cm in diameter.  Multiple stones are seen in the distal common bile duct, largest of which measures approximately 1 cm in diameter.  Liver: No focal mass lesion identified.  Within normal limits in parenchymal echogenicity.  IVC:  Appears normal.  Pancreas:  No abnormality identified.  Spleen:  Within normal limits in size and echotexture.  Right kidney:  Normal in size and parenchymal echogenicity.  No evidence of mass or hydronephrosis.  Left kidney:  Normal in size and parenchymal echogenicity.  No evidence of mass or hydronephrosis.  Abdominal Aorta:  No aneurysm identified.  IMPRESSION:  1. Distended gallbladder with sludge and several tiny gallstones. Mild diffuse gallbladder wall thickening and small amount of pericholecystic fluid also noted; acute cholecystitis cannot be excluded. 2. Choledocholithiasis, with diffuse intra and extrahepatic biliary dilatation.  Original Report Authenticated By: Danae Orleans, M.D.   Dg Abd Acute W/chest  05/26/2011  *RADIOLOGY REPORT*  Clinical Data: Right-sided abdominal pain.  History of urinary tract calculi.  ACUTE ABDOMEN SERIES  (ABDOMEN 2 VIEW & CHEST 1 VIEW) 05/26/2011:  Comparison: CT abdomen and pelvis earlier same date 1449 hours. Unenhanced CT chest 05/15/2007.  Portable chest x-ray 05/14/2007.  Findings: Bowel gas pattern unremarkable without evidence of obstruction or significant ileus.  No evidence of free air or significant air fluid levels on the erect image.  Air-fluid levels in the colon consistent with liquid stool.  Possibly enlarged liver, extending well below the costal margin, with probable Reidel lobe.  No visible opaque urinary tract calculi.  Degenerative changes involving the lumbar spine.  Cardiac silhouette upper normal in size.  Thoracic aorta tortuous and atherosclerotic.  Hilar and mediastinal contours otherwise unremarkable.  Lungs hyperinflated but clear.  IMPRESSION:  1.  No acute abdominal abnormality. 2.  Possible hepatomegaly. 3.  Hyperinflation consistent with COPD and/or asthma.  No acute cardiopulmonary disease.  Original Report Authenticated By: Arnell Sieving, M.D.    Medications: I have reviewed the patient's current medications. Scheduled Meds:   . cefTRIAXone (ROCEPHIN) IV  1 g Intravenous Once  . pantoprazole (PROTONIX) IV  40 mg Intravenous QHS  . piperacillin-tazobactam (ZOSYN)  IV  3.375 g Intravenous Once  . piperacillin-tazobactam (ZOSYN)  IV  3.375 g Intravenous Q8H  . potassium chloride  20 mEq Oral Once   Continuous Infusions:   . sodium chloride 75 mL/hr at 05/26/11 2155  . 0.9 % NaCl with KCl 20 mEq / L 75 mL/hr at 05/27/11 1150  PRN Meds:.acetaminophen, acetaminophen, HYDROmorphone, ondansetron (ZOFRAN) IV, ondansetron  Assessment/Plan:  1. Acute cholecystitis/RUQ abdominal pain.  Continue Zosyn, antibiotic since 05/26/2011.  General surgery onboard.  GI consult set for consideration of possible ERCP.  2. Fever.  Secondary to #1.  3. Jaundice.  Secondary to #1.  4. Leukocytosis.  Stable.  5. Hypokalemia.  Replace when necessary.  6. Anemia.  Will send  for anemia panel.  7. Thrombocytopenia likely due to #1.  Monitor for now.  8. Prophylax.  SCDs.  9. CODE STATUS.  Full code.   LOS: 1 day  Shanena Pellegrino A, MD 05/27/2011, 2:04 PM

## 2011-05-28 NOTE — Interval H&P Note (Signed)
History and Physical Interval Note:   05/28/2011   9:38 AM   Mena Goes  has presented today for surgery, with the diagnosis of Choledocholithiasis  The various methods of treatment have been discussed with the patient and family. After consideration of risks, benefits and other options for treatment, the patient has consented to  Procedure(s): ENDOSCOPIC RETROGRADE CHOLANGIOPANCREATOGRAPHY (ERCP) as a surgical intervention .  The patients' history has been reviewed, patient examined, no change in status, stable for surgery.  I have reviewed the patients' chart and labs.  Questions were answered to the patient's satisfaction.     Theda Belfast  MD

## 2011-05-28 NOTE — Progress Notes (Signed)
Subjective: Souther patient after ERCP.  Resting.  Objective: Vital signs in last 24 hours: Filed Vitals:   05/28/11 1035 05/28/11 1040 05/28/11 1053 05/28/11 1148  BP: 147/75 136/76  126/60  Pulse:    64  Temp:      TempSrc:      Resp: 20 22  18   Height:      Weight:      SpO2: 99% 99% 96% 96%   Weight change:   Intake/Output Summary (Last 24 hours) at 05/28/11 1242 Last data filed at 05/28/11 0500  Gross per 24 hour  Intake   1450 ml  Output    401 ml  Net   1049 ml    Physical Exam: General: Awake, Oriented, No acute distress. HEENT: EOMI. Neck: Supple CV: S1 and S2 Lungs: Clear to ascultation bilaterally Abdomen: Soft, Nontender, Nondistended, +bowel sounds. Ext: Good pulses. Trace edema.  Lab Results:  Basename 05/28/11 0700 05/27/11 0630  NA 140 138  K 3.2* 3.3*  CL 107 105  CO2 22 24  GLUCOSE 101* 103*  BUN 26* 28*  CREATININE 1.16 1.25  CALCIUM 8.5 8.2*  MG -- --  PHOS -- --    Basename 05/28/11 0700 05/27/11 0630  AST 27 43*  ALT 37 52  ALKPHOS 179* 192*  BILITOT 2.0* 4.5*  PROT 5.8* 5.9*  ALBUMIN 2.5* 2.6*    Basename 05/27/11 0630 05/26/11 1401  LIPASE 13 19  AMYLASE -- --    Basename 05/28/11 0700 05/27/11 0630 05/26/11 1401  WBC 14.7* 13.1* --  NEUTROABS -- -- 13.9*  HGB 10.6* 10.2* --  HCT 32.7* 30.2* --  MCV 80.7 80.5 --  PLT 129* 116* --   No results found for this basename: CKTOTAL:3,CKMB:3,CKMBINDEX:3,TROPONINI:3 in the last 72 hours No results found for this basename: POCBNP:3 in the last 72 hours No results found for this basename: DDIMER:2 in the last 72 hours No results found for this basename: HGBA1C:2 in the last 72 hours No results found for this basename: CHOL:2,HDL:2,LDLCALC:2,TRIG:2,CHOLHDL:2,LDLDIRECT:2 in the last 72 hours No results found for this basename: TSH,T4TOTAL,FREET3,T3FREE,THYROIDAB in the last 72 hours  Basename 05/28/11 0700  VITAMINB12 --  FOLATE --  FERRITIN --  TIBC --  IRON --    RETICCTPCT 1.0    Micro Results: Recent Results (from the past 240 hour(s))  URINE CULTURE     Status: Normal   Collection Time   05/26/11  1:31 PM      Component Value Range Status Comment   Specimen Description URINE, CLEAN CATCH   Final    Special Requests NONE   Final    Setup Time [id number]   Final    Colony Count 5,000 COLONIES/ML   Final    Culture INSIGNIFICANT GROWTH   Final    Report Status 05/27/2011 FINAL   Final   CULTURE, BLOOD (ROUTINE X 2)     Status: Normal (Preliminary result)   Collection Time   05/26/11 10:30 PM      Component Value Range Status Comment   Specimen Description BLOOD ARM RIGHT   Final    Special Requests BOTTLES DRAWN AEROBIC AND ANAEROBIC 10CC   Final    Setup Time [id number]   Final    Culture     Final    Value:        BLOOD CULTURE RECEIVED NO GROWTH TO DATE CULTURE WILL BE HELD FOR 5 DAYS BEFORE ISSUING A FINAL NEGATIVE REPORT   Report Status PENDING  Incomplete   CULTURE, BLOOD (ROUTINE X 2)     Status: Normal (Preliminary result)   Collection Time   05/26/11 10:40 PM      Component Value Range Status Comment   Specimen Description BLOOD ARM LEFT   Final    Special Requests BOTTLES DRAWN AEROBIC AND ANAEROBIC 10CC   Final    Setup Time [id number]   Final    Culture     Final    Value:        BLOOD CULTURE RECEIVED NO GROWTH TO DATE CULTURE WILL BE HELD FOR 5 DAYS BEFORE ISSUING A FINAL NEGATIVE REPORT   Report Status PENDING   Incomplete     Medications: I have reviewed the patient's current medications. Scheduled Meds:    . pantoprazole (PROTONIX) IV  40 mg Intravenous QHS  . piperacillin-tazobactam (ZOSYN)  IV  3.375 g Intravenous Q8H   Continuous Infusions:    . 0.9 % NaCl with KCl 20 mEq / L 75 mL/hr at 05/28/11 0033  . DISCONTD: sodium chloride 75 mL/hr at 05/26/11 2155  . DISCONTD: 0.9 % NaCl with KCl 20 mEq / L 75 mL/hr at 05/27/11 1150   PRN Meds:.acetaminophen, acetaminophen, hydrALAZINE, HYDROmorphone,  labetalol, ondansetron (ZOFRAN) IV, ondansetron, DISCONTD: 10 mL Omnipaque 300 mg/mL mixed with 0.9% normal saline (10 mL) injection, DISCONTD: butamben-tetracaine-benzocaine, DISCONTD: fentaNYL, DISCONTD: glucagon, DISCONTD: midazolam  Assessment/Plan:  1. Acute cholecystitis/RUQ abdominal pain.  Continue Zosyn, antibiotic since 05/26/2011.  Had ERCP with sphincterotomy today.  Multiple stones noted on ERCP.  GI is recommending laparoscopic cholecystectomy with intraoperative cholangiogram.  General surgery onboard.   2. Fever.  Secondary to #1.  Resolved.  3. Jaundice.  Secondary to #1.  4. Leukocytosis.  Stable.  5. Hypokalemia.  Replace when necessary.  6. Anemia.  Will send for anemia panel.  7. Thrombocytopenia likely due to #1.  Monitor for now.  8. elevated blood pressure.  Monitor for now, has when necessary hydralazine and labetalol.  Patient is not on any antihypertensives at home and if blood pressure continues to be elevated will consider starting the patient on antihypertensive medications.  9. Prophylax.  SCDs.  10. CODE STATUS.  Full code.   LOS: 2 days  Delanna Blacketer A, MD 05/28/2011, 12:42 PM

## 2011-05-29 DIAGNOSIS — K8050 Calculus of bile duct without cholangitis or cholecystitis without obstruction: Secondary | ICD-10-CM

## 2011-05-29 DIAGNOSIS — R740 Nonspecific elevation of levels of transaminase and lactic acid dehydrogenase [LDH]: Secondary | ICD-10-CM

## 2011-05-29 DIAGNOSIS — R1011 Right upper quadrant pain: Secondary | ICD-10-CM

## 2011-05-29 LAB — COMPREHENSIVE METABOLIC PANEL
ALT: 27 U/L (ref 0–53)
Albumin: 2.2 g/dL — ABNORMAL LOW (ref 3.5–5.2)
Alkaline Phosphatase: 143 U/L — ABNORMAL HIGH (ref 39–117)
BUN: 26 mg/dL — ABNORMAL HIGH (ref 6–23)
Chloride: 112 mEq/L (ref 96–112)
Glucose, Bld: 95 mg/dL (ref 70–99)
Potassium: 3.3 mEq/L — ABNORMAL LOW (ref 3.5–5.1)
Sodium: 142 mEq/L (ref 135–145)
Total Bilirubin: 1.3 mg/dL — ABNORMAL HIGH (ref 0.3–1.2)

## 2011-05-29 LAB — CBC
MCHC: 33.4 g/dL (ref 30.0–36.0)
MCV: 80.8 fL (ref 78.0–100.0)
Platelets: 134 10*3/uL — ABNORMAL LOW (ref 150–400)
RDW: 17.4 % — ABNORMAL HIGH (ref 11.5–15.5)
WBC: 9.9 10*3/uL (ref 4.0–10.5)

## 2011-05-29 MED ORDER — SIMETHICONE 80 MG PO CHEW
80.0000 mg | CHEWABLE_TABLET | Freq: Two times a day (BID) | ORAL | Status: DC | PRN
  Administered 2011-05-29: 80 mg via ORAL
  Filled 2011-05-29 (×2): qty 1

## 2011-05-29 MED ORDER — POTASSIUM CHLORIDE CRYS ER 20 MEQ PO TBCR
40.0000 meq | EXTENDED_RELEASE_TABLET | Freq: Once | ORAL | Status: AC
  Administered 2011-05-29: 40 meq via ORAL
  Filled 2011-05-29 (×2): qty 1

## 2011-05-29 MED ORDER — AMLODIPINE BESYLATE 2.5 MG PO TABS
2.5000 mg | ORAL_TABLET | Freq: Every day | ORAL | Status: DC
  Administered 2011-05-29 – 2011-06-01 (×4): 2.5 mg via ORAL
  Filled 2011-05-29 (×4): qty 1

## 2011-05-29 NOTE — Progress Notes (Signed)
Attending addendum: tolerating full liquids, WBC back to normal, for ?  lap chole tomorrow, then follow LFT's. Possible repeat ERCP if stones in common hepatic duct D.Juanda Chance MD

## 2011-05-29 NOTE — Progress Notes (Signed)
1 Day Post-Op  Subjective: Pt ok. Still some soreness. No N/V. ERCP yesterday.  Objective: Vital signs in last 24 hours: Temp:  [97 F (36.1 C)-99.3 F (37.4 C)] 99.3 F (37.4 C) (11/19 0438) Pulse Rate:  [59-74] 59  (11/19 0438) Resp:  [13-22] 20  (11/19 0438) BP: (126-179)/(60-92) 165/69 mmHg (11/19 0438) SpO2:  [95 %-99 %] 95 % (11/19 0438) FiO2 (%):  [2 %] 2 % (11/18 1103) Last BM Date: 05/27/11  Intake/Output this shift:    Physical Exam: Abdomen: flat, soft. Mildly tender RUQ, no mass  Labs: CBC  Basename 05/28/11 0700 05/27/11 0630  WBC 14.7* 13.1*  HGB 10.6* 10.2*  HCT 32.7* 30.2*  PLT 129* 116*   BMET  Basename 05/28/11 0700 05/27/11 0630  NA 140 138  K 3.2* 3.3*  CL 107 105  CO2 22 24  GLUCOSE 101* 103*  BUN 26* 28*  CREATININE 1.16 1.25  CALCIUM 8.5 8.2*   LFT  Basename 05/28/11 0700 05/27/11 0630  PROT 5.8* --  ALBUMIN 2.5* --  AST 27 --  ALT 37 --  ALKPHOS 179* --  BILITOT 2.0* --  BILIDIR -- 3.8*  IBILI -- 0.7  LIPASE -- 13   PT/INR No results found for this basename: LABPROT:2,INR:2 in the last 72 hours ABG No results found for this basename: PHART:2,PCO2:2,PO2:2,HCO3:2 in the last 72 hours  Studies/Results: Dg Ercp With Sphincterotomy  05/28/2011  *RADIOLOGY REPORT*  Clinical Data: CBD stones  ERCP  Comparison:  None.  Technique:  Multiple spot images obtained with the fluoroscopic device and submitted for interpretation post-procedure.  Findings: Two fluoroscopic images are submitted.  Total fluoroscopic time of 8 minutes 25 seconds.  Dilatation of the common bile duct with an area of abrupt transition.  There is suggestion of small filling defects within the dilated segment. Subsequent image demonstrates wire extension past the area of transition.  IMPRESSION: ERCP images demonstrating dilated common bile duct.  These images were submitted for radiologic interpretation only. Please see the procedural report for the amount of  contrast and the fluoroscopy time utilized.  Original Report Authenticated By: Waneta Martins, M.D.    Assessment: Principal Problem:  *Acute cholecystitis Active Problems:  RUQ abdominal pain  Fever  Jaundice  Leukocytosis  Hypokalemia  Anemia  s/p Procedure(s): ENDOSCOPIC RETROGRADE CHOLANGIOPANCREATOGRAPHY (ERCP) Plan: Cont NPO x ice chips. Enzymes decreasing, check in am. Plan for lap chole tomorrow. I discussed the procedure in detail.  We discussed the risks and benefits of a laparoscopic cholecystectomy and cholangiogram including, but not limited to bleeding, infection, injury to surrounding structures such as the intestine or liver, bile leak, retained gallstones, need to convert to an open procedure, prolonged diarrhea, blood clots such as DVT, common bile duct injury, anesthesia risks, and possible need for additional procedures.  The likelihood of improvement in symptoms and return to the patient's normal status is good. We discussed the typical post-operative recovery course.   LOS: 3 days    Marianna Fuss 05/29/2011

## 2011-05-29 NOTE — Progress Notes (Signed)
Subjective: Feeling better today.  Wondering when he can be discharged.  Objective: Vital signs in last 24 hours: Filed Vitals:   05/28/11 1925 05/28/11 2045 05/29/11 0438 05/29/11 0800  BP:  166/69 165/69 169/73  Pulse:   59 65  Temp: 98 F (36.7 C) 97 F (36.1 C) 99.3 F (37.4 C) 98.6 F (37 C)  TempSrc: Oral Oral Oral Oral  Resp:  20 20 14   Height:      Weight:      SpO2:  95% 95% 98%   Weight change:   Intake/Output Summary (Last 24 hours) at 05/29/11 1258 Last data filed at 05/29/11 0900  Gross per 24 hour  Intake 1912.5 ml  Output      5 ml  Net 1907.5 ml   Physical Exam: General: Awake, Oriented, No acute distress. HEENT: EOMI. Neck: Supple CV: S1 and S2 Lungs: Clear to ascultation bilaterally Abdomen: Soft, Nontender, Nondistended, +bowel sounds. Ext: Good pulses. Trace edema.  Lab Results:  Basename 05/29/11 0650 05/28/11 0700  NA 142 140  K 3.3* 3.2*  CL 112 107  CO2 22 22  GLUCOSE 95 101*  BUN 26* 26*  CREATININE 1.14 1.16  CALCIUM 8.3* 8.5  MG 2.3 --  PHOS -- --    Basename 05/29/11 0650 05/28/11 0700  AST 21 27  ALT 27 37  ALKPHOS 143* 179*  BILITOT 1.3* 2.0*  PROT 5.6* 5.8*  ALBUMIN 2.2* 2.5*    Basename 05/27/11 0630 05/26/11 1401  LIPASE 13 19  AMYLASE -- --    Basename 05/29/11 0650 05/28/11 0700 05/26/11 1401  WBC 9.9 14.7* --  NEUTROABS -- -- 13.9*  HGB 10.4* 10.6* --  HCT 31.1* 32.7* --  MCV 80.8 80.7 --  PLT 134* 129* --   No results found for this basename: CKTOTAL:3,CKMB:3,CKMBINDEX:3,TROPONINI:3 in the last 72 hours No results found for this basename: POCBNP:3 in the last 72 hours No results found for this basename: DDIMER:2 in the last 72 hours No results found for this basename: HGBA1C:2 in the last 72 hours No results found for this basename: CHOL:2,HDL:2,LDLCALC:2,TRIG:2,CHOLHDL:2,LDLDIRECT:2 in the last 72 hours No results found for this basename: TSH,T4TOTAL,FREET3,T3FREE,THYROIDAB in the last 72  hours  Basename 05/28/11 0700  VITAMINB12 383  FOLATE 11.4  FERRITIN 344*  TIBC Not calculated due to Iron <10.  IRON <10*  RETICCTPCT 1.0    Micro Results: Recent Results (from the past 240 hour(s))  URINE CULTURE     Status: Normal   Collection Time   05/26/11  1:31 PM      Component Value Range Status Comment   Specimen Description URINE, CLEAN CATCH   Final    Special Requests NONE   Final    Setup Time [id number]   Final    Colony Count 5,000 COLONIES/ML   Final    Culture INSIGNIFICANT GROWTH   Final    Report Status 05/27/2011 FINAL   Final   CULTURE, BLOOD (ROUTINE X 2)     Status: Normal (Preliminary result)   Collection Time   05/26/11 10:30 PM      Component Value Range Status Comment   Specimen Description BLOOD ARM RIGHT   Final    Special Requests BOTTLES DRAWN AEROBIC AND ANAEROBIC 10CC   Final    Setup Time [id number]   Final    Culture     Final    Value:        BLOOD CULTURE RECEIVED NO GROWTH TO DATE CULTURE WILL  BE HELD FOR 5 DAYS BEFORE ISSUING A FINAL NEGATIVE REPORT   Report Status PENDING   Incomplete   CULTURE, BLOOD (ROUTINE X 2)     Status: Normal (Preliminary result)   Collection Time   05/26/11 10:40 PM      Component Value Range Status Comment   Specimen Description BLOOD ARM LEFT   Final    Special Requests BOTTLES DRAWN AEROBIC AND ANAEROBIC 10CC   Final    Setup Time [id number]   Final    Culture     Final    Value:        BLOOD CULTURE RECEIVED NO GROWTH TO DATE CULTURE WILL BE HELD FOR 5 DAYS BEFORE ISSUING A FINAL NEGATIVE REPORT   Report Status PENDING   Incomplete     Medications: I have reviewed the patient's current medications. Scheduled Meds:    . pantoprazole (PROTONIX) IV  40 mg Intravenous QHS  . piperacillin-tazobactam (ZOSYN)  IV  3.375 g Intravenous Q8H  . potassium chloride  40 mEq Oral Once   Continuous Infusions:    . sodium chloride 20 mL/hr at 05/29/11 0600   PRN Meds:.acetaminophen, acetaminophen,  hydrALAZINE, HYDROmorphone, labetalol, ondansetron (ZOFRAN) IV, ondansetron, simethicone  Assessment/Plan: 1. Acute cholecystitis/RUQ abdominal pain.  Continue Zosyn, antibiotic since 05/26/2011.  Had ERCP with sphincterotomy on 05/28/2011.  Plan for laparoscopic cholecystectomy with intraoperative cholangiogram tomorrow.  2. Fever.  Secondary to #1.  Resolved.  3. Jaundice.  Secondary to #1.  4. Leukocytosis.  Stable.  5. Hypokalemia.  Replace when necessary.  6. Anemia.  Suggests iron deficiency anemia.  7. Thrombocytopenia likely due to #1.  Monitor for now, continues to improve.  8. Elevated blood pressure now with diagnosis of hypertension.  Spoke with the patient's daughter who indicated that the patient has not been compliant with a home antihypertensive medications.  Will start the patient on amlodipine low dose.  9. Prophylax.  SCDs.  10. CODE STATUS.  DO NOT INTUBATE.  Okay with chest compressions and resuscitation.  This was discussed with patient and daughter today.   LOS: 3 days  Fallyn Munnerlyn A, MD 05/29/2011, 12:58 PM

## 2011-05-29 NOTE — Progress Notes (Signed)
Pt seen and examined.  He would benefit from lap chole and IOC. The procedure has been discussed with the patient. Operative and non operative treatments have been discussed. Risks of surgery include bleeding, iInfection,  Common bile duct injury,  Injury to the stomach,liver, colon,small intestine, abdominal wall,  Diaphragm,  Major blood vessels,  And the need for an open procedure.  Other risks include worsening of medical problems, death,  DVT and pulmonary embolism, and cardiovascular events.   Medical options have also been discussed. The patient has been informed of long term expectations of surgery and non surgical options,  The patient agrees to proceed.  Agree with note.

## 2011-05-29 NOTE — Progress Notes (Signed)
     [facility] Daily Rounding Note 05/29/2011, 10:10 AM  SUBJECTIVE: Plan is for lap chole tomorrow.  Denies abd pain.  No nausea.  Feels well.   OBJECTIVE: Looks well, wirey/sinewy, not acutely ill, not frail. Vital signs in last 24 hours: Temp:  [97 F (36.1 C)-99.3 F (37.4 C)] 98.6 F (37 C) (11/19 0800) Pulse Rate:  [59-73] 65  (11/19 0800) Resp:  [13-22] 14  (11/19 0800) BP: (126-179)/(60-92) 169/73 mmHg (11/19 0800) SpO2:  [95 %-99 %] 98 % (11/19 0800) FiO2 (%):  [2 %] 2 % (11/18 1103) Last BM Date: 05/27/11  Heart: RRR  Chest: Few basilar rales. Abdomen: NT, active BS, ND.   Extremities: No edema, feet warm  Neuro/Psych:  Pleasant , not at all confused.  Intake/Output from previous day: 11/18 0701 - 11/19 0700 In: 1672.5 [P.O.:540; I.V.:1033; IV Piggyback:99.5] Out: 5 [Urine:4; Stool:1]  Intake/Output this shift:    Lab Results:  Basename 05/29/11 0650 05/28/11 0700 05/27/11 0630  WBC 9.9 14.7* 13.1*  HGB 10.4* 10.6* 10.2*  HCT 31.1* 32.7* 30.2*  PLT 134* 129* 116*   BMET  Basename 05/29/11 0650 05/28/11 0700 05/27/11 0630  NA 142 140 138  K 3.3* 3.2* 3.3*  CL 112 107 105  CO2 22 22 24   GLUCOSE 95 101* 103*  BUN 26* 26* 28*  CREATININE 1.14 1.16 1.25  CALCIUM 8.3* 8.5 8.2*   LFT  Basename 05/29/11 0650 05/27/11 0630  PROT 5.6* --  ALBUMIN 2.2* --  AST 21 --  ALT 27 --  ALKPHOS 143* --  BILITOT 1.3* --  BILIDIR -- 3.8*  IBILI -- 0.7    Studies/Results: Dg Ercp With Sphincterotomy  05/28/2011  *RADIOLOGY REPORT*  Clinical Data: CBD stones  ERCP  Comparison:  None.  Technique:  Multiple spot images obtained with the fluoroscopic device and submitted for interpretation post-procedure.  Findings: Two fluoroscopic images are submitted.  Total fluoroscopic time of 8 minutes 25 seconds.  Dilatation of the common bile duct with an area of abrupt transition.  There is suggestion of small filling defects within the dilated segment. Subsequent image  demonstrates wire extension past the area of transition.  IMPRESSION: ERCP images demonstrating dilated common bile duct.  These images were submitted for radiologic interpretation only. Please see the procedural report for the amount of contrast and the fluoroscopy time utilized.  Original Report Authenticated By: Waneta Martins, M.D.    ASSESMENT: 1.  Choledocholithiasis.  S/p ERCP/sphinct/ stone removal yesterday.  Dr. Elnoria Howard unable to reach wire beyond area proximal to cystic duct, contrast did not enter intrahepatic ducts, so worry that there is a retained stone at duct just proximal to the cystic duct.  However,  LFTs improved.  On Zosyn. 2.  Cholelithiasis.  Plan for Lap Chole . 3.  S/p subtotal colectomy to address diverticular bleed.  Dr. Randa Evens and [place] involved 2008. 4.  Anemia, normocytic.  Iron level less than 10, ferritin 344 5.  Hypokalemia   PLAN: 1.  Lap Chole with IOC tomorrow.   LOS: 3 days   Nathaniel Adkins  05/29/2011, 10:10 AM

## 2011-05-30 ENCOUNTER — Encounter (HOSPITAL_COMMUNITY): Payer: Self-pay | Admitting: Surgery

## 2011-05-30 ENCOUNTER — Encounter (HOSPITAL_COMMUNITY): Payer: Self-pay | Admitting: Anesthesiology

## 2011-05-30 ENCOUNTER — Encounter (HOSPITAL_COMMUNITY)
Admission: EM | Disposition: A | Discharge status: Home / Self Care | Payer: Self-pay | Source: Home / Self Care | Attending: Internal Medicine

## 2011-05-30 ENCOUNTER — Inpatient Hospital Stay (HOSPITAL_COMMUNITY): Payer: Medicare Other | Admitting: Anesthesiology

## 2011-05-30 ENCOUNTER — Other Ambulatory Visit (INDEPENDENT_AMBULATORY_CARE_PROVIDER_SITE_OTHER): Payer: Self-pay | Admitting: Surgery

## 2011-05-30 DIAGNOSIS — K8010 Calculus of gallbladder with chronic cholecystitis without obstruction: Secondary | ICD-10-CM

## 2011-05-30 DIAGNOSIS — R740 Nonspecific elevation of levels of transaminase and lactic acid dehydrogenase [LDH]: Secondary | ICD-10-CM

## 2011-05-30 DIAGNOSIS — R1011 Right upper quadrant pain: Secondary | ICD-10-CM

## 2011-05-30 DIAGNOSIS — K8050 Calculus of bile duct without cholangitis or cholecystitis without obstruction: Secondary | ICD-10-CM

## 2011-05-30 HISTORY — PX: CHOLECYSTECTOMY: SHX55

## 2011-05-30 LAB — CBC
HCT: 30.1 % — ABNORMAL LOW (ref 39.0–52.0)
Hemoglobin: 10.2 g/dL — ABNORMAL LOW (ref 13.0–17.0)
MCV: 80.3 fL (ref 78.0–100.0)
RBC: 3.75 MIL/uL — ABNORMAL LOW (ref 4.22–5.81)
WBC: 7.8 10*3/uL (ref 4.0–10.5)

## 2011-05-30 LAB — COMPREHENSIVE METABOLIC PANEL
Albumin: 2.2 g/dL — ABNORMAL LOW (ref 3.5–5.2)
Alkaline Phosphatase: 130 U/L — ABNORMAL HIGH (ref 39–117)
BUN: 24 mg/dL — ABNORMAL HIGH (ref 6–23)
Calcium: 8.1 mg/dL — ABNORMAL LOW (ref 8.4–10.5)
Creatinine, Ser: 1.22 mg/dL (ref 0.50–1.35)
GFR calc Af Amer: 59 mL/min — ABNORMAL LOW (ref >90)
Glucose, Bld: 85 mg/dL (ref 70–99)
Potassium: 3.4 mEq/L — ABNORMAL LOW (ref 3.5–5.1)
Total Protein: 5.5 g/dL — ABNORMAL LOW (ref 6.0–8.3)

## 2011-05-30 SURGERY — LAPAROSCOPIC CHOLECYSTECTOMY WITH INTRAOPERATIVE CHOLANGIOGRAM
Anesthesia: General | Site: Abdomen | Wound class: Contaminated

## 2011-05-30 MED ORDER — HYDROMORPHONE HCL PF 1 MG/ML IJ SOLN
0.5000 mg | INTRAMUSCULAR | Status: DC | PRN
  Administered 2011-05-30 – 2011-05-31 (×5): 0.5 mg via INTRAVENOUS
  Filled 2011-05-30 (×5): qty 1

## 2011-05-30 MED ORDER — SODIUM CHLORIDE 0.9 % IV SOLN: 1.5000 g | Freq: Once | INTRAVENOUS | Status: DC

## 2011-05-30 MED ORDER — IOHEXOL 300 MG/ML  SOLN
INTRAMUSCULAR | Status: DC | PRN
  Administered 2011-05-30: 18 mL

## 2011-05-30 MED ORDER — ONDANSETRON HCL 4 MG/2ML IJ SOLN
INTRAMUSCULAR | Status: DC | PRN
  Administered 2011-05-30: 4 mg via INTRAVENOUS

## 2011-05-30 MED ORDER — ROCURONIUM BROMIDE 100 MG/10ML IV SOLN
INTRAVENOUS | Status: DC | PRN
  Administered 2011-05-30: 40 mg via INTRAVENOUS
  Administered 2011-05-30: 10 mg via INTRAVENOUS

## 2011-05-30 MED ORDER — SODIUM CHLORIDE 0.9 % IR SOLN
Status: DC | PRN
  Administered 2011-05-30 (×2): 1

## 2011-05-30 MED ORDER — DROPERIDOL 2.5 MG/ML IJ SOLN
0.6250 mg | INTRAMUSCULAR | Status: DC | PRN
  Filled 2011-05-30: qty 0.25

## 2011-05-30 MED ORDER — BUPIVACAINE-EPINEPHRINE 0.25% -1:200000 IJ SOLN
INTRAMUSCULAR | Status: DC | PRN
  Administered 2011-05-30: 10 mL

## 2011-05-30 MED ORDER — HYDROMORPHONE HCL PF 1 MG/ML IJ SOLN: 0.2500 mg | INTRAMUSCULAR | Status: DC | PRN

## 2011-05-30 MED ORDER — FENTANYL CITRATE 0.05 MG/ML IJ SOLN
INTRAMUSCULAR | Status: DC | PRN
  Administered 2011-05-30 (×5): 50 ug via INTRAVENOUS

## 2011-05-30 MED ORDER — GLYCOPYRROLATE 0.2 MG/ML IJ SOLN
INTRAMUSCULAR | Status: DC | PRN
  Administered 2011-05-30: 0.2 mg via INTRAVENOUS
  Administered 2011-05-30: .6 mg via INTRAVENOUS

## 2011-05-30 MED ORDER — KCL IN DEXTROSE-NACL 10-5-0.45 MEQ/L-%-% IV SOLN
INTRAVENOUS | Status: DC
  Administered 2011-05-30 – 2011-05-31 (×2): via INTRAVENOUS
  Administered 2011-05-31: 100 mL/h via INTRAVENOUS
  Filled 2011-05-30 (×6): qty 1000

## 2011-05-30 MED ORDER — ACETAMINOPHEN 325 MG PO TABS
650.0000 mg | ORAL_TABLET | Freq: Four times a day (QID) | ORAL | Status: DC | PRN
  Administered 2011-05-30: 650 mg via ORAL
  Filled 2011-05-30: qty 2

## 2011-05-30 MED ORDER — SODIUM CHLORIDE 0.9 % IV SOLN
INTRAVENOUS | Status: DC | PRN
  Administered 2011-05-30 (×2): via INTRAVENOUS

## 2011-05-30 MED ORDER — NEOSTIGMINE METHYLSULFATE 1 MG/ML IJ SOLN
INTRAMUSCULAR | Status: DC | PRN
  Administered 2011-05-30: 5 mg via INTRAVENOUS

## 2011-05-30 MED ORDER — PROPOFOL 10 MG/ML IV EMUL
INTRAVENOUS | Status: DC | PRN
  Administered 2011-05-30: 60 mg via INTRAVENOUS
  Administered 2011-05-30: 40 mg via INTRAVENOUS
  Administered 2011-05-30: 100 mg via INTRAVENOUS

## 2011-05-30 MED ORDER — POTASSIUM CHLORIDE CRYS ER 20 MEQ PO TBCR
40.0000 meq | EXTENDED_RELEASE_TABLET | Freq: Once | ORAL | Status: AC
  Administered 2011-05-30: 40 meq via ORAL
  Filled 2011-05-30: qty 2

## 2011-05-30 MED ORDER — LACTATED RINGERS IV SOLN: INTRAVENOUS | Status: DC

## 2011-05-30 MED ORDER — PHENYLEPHRINE HCL 10 MG/ML IJ SOLN
INTRAMUSCULAR | Status: DC | PRN
  Administered 2011-05-30: 40 ug via INTRAVENOUS

## 2011-05-30 SURGICAL SUPPLY — 49 items
APPLIER CLIP 5 13 M/L LIGAMAX5 (MISCELLANEOUS) ×4
APPLIER CLIP ROT 10 11.4 M/L (STAPLE) ×2
BLADE SURG ROTATE 9660 (MISCELLANEOUS) IMPLANT
CANISTER SUCTION 2500CC (MISCELLANEOUS) ×2 IMPLANT
CHLORAPREP W/TINT 26ML (MISCELLANEOUS) ×2 IMPLANT
CLIP APPLIE 5 13 M/L LIGAMAX5 (MISCELLANEOUS) ×2 IMPLANT
CLIP APPLIE ROT 10 11.4 M/L (STAPLE) ×1 IMPLANT
CLOTH BEACON ORANGE TIMEOUT ST (SAFETY) ×2 IMPLANT
COVER MAYO STAND STRL (DRAPES) ×2 IMPLANT
COVER SURGICAL LIGHT HANDLE (MISCELLANEOUS) ×2 IMPLANT
DECANTER SPIKE VIAL GLASS SM (MISCELLANEOUS) ×4 IMPLANT
DERMABOND ADHESIVE PROPEN (GAUZE/BANDAGES/DRESSINGS) ×1
DERMABOND ADVANCED (GAUZE/BANDAGES/DRESSINGS)
DERMABOND ADVANCED .7 DNX12 (GAUZE/BANDAGES/DRESSINGS) IMPLANT
DERMABOND ADVANCED .7 DNX6 (GAUZE/BANDAGES/DRESSINGS) ×1 IMPLANT
DEVICE TROCAR PUNCTURE CLOSURE (ENDOMECHANICALS) ×2 IMPLANT
DRAPE C-ARM 42X72 X-RAY (DRAPES) ×2 IMPLANT
DRAPE UTILITY 15X26 W/TAPE STR (DRAPE) ×4 IMPLANT
ELECT REM PT RETURN 9FT ADLT (ELECTROSURGICAL) ×4
ELECTRODE REM PT RTRN 9FT ADLT (ELECTROSURGICAL) ×2 IMPLANT
EVACUATOR SILICONE 100CC (DRAIN) ×2 IMPLANT
GLOVE BIO SURGEON STRL SZ7.5 (GLOVE) ×4 IMPLANT
GLOVE BIO SURGEON STRL SZ8 (GLOVE) ×2 IMPLANT
GLOVE BIOGEL PI IND STRL 8 (GLOVE) ×2 IMPLANT
GLOVE BIOGEL PI INDICATOR 8 (GLOVE) ×2
GLOVE OPTIFIT SS 8.0 STRL (GLOVE) ×2 IMPLANT
GOWN STRL NON-REIN LRG LVL3 (GOWN DISPOSABLE) ×8 IMPLANT
KIT BASIN OR (CUSTOM PROCEDURE TRAY) ×2 IMPLANT
KIT ROOM TURNOVER OR (KITS) ×2 IMPLANT
NS IRRIG 1000ML POUR BTL (IV SOLUTION) ×2 IMPLANT
PAD ARMBOARD 7.5X6 YLW CONV (MISCELLANEOUS) ×2 IMPLANT
POUCH SPECIMEN RETRIEVAL 10MM (ENDOMECHANICALS) ×2 IMPLANT
SCISSORS LAP 5X35 DISP (ENDOMECHANICALS) IMPLANT
SET CHOLANGIOGRAPH 5 50 .035 (SET/KITS/TRAYS/PACK) ×2 IMPLANT
SET IRRIG TUBING LAPAROSCOPIC (IRRIGATION / IRRIGATOR) ×2 IMPLANT
SLEEVE ENDOPATH XCEL 5M (ENDOMECHANICALS) ×6 IMPLANT
SPECIMEN JAR SMALL (MISCELLANEOUS) ×2 IMPLANT
SPONGE GAUZE 4X4 12PLY (GAUZE/BANDAGES/DRESSINGS) ×2 IMPLANT
SUT ETHILON 2 0 FS 18 (SUTURE) ×2 IMPLANT
SUT MNCRL AB 4-0 PS2 18 (SUTURE) ×2 IMPLANT
TAPE CLOTH SURG 4X10 WHT LF (GAUZE/BANDAGES/DRESSINGS) ×2 IMPLANT
TOWEL OR 17X24 6PK STRL BLUE (TOWEL DISPOSABLE) ×2 IMPLANT
TOWEL OR 17X26 10 PK STRL BLUE (TOWEL DISPOSABLE) ×2 IMPLANT
TRAY LAPAROSCOPIC (CUSTOM PROCEDURE TRAY) ×2 IMPLANT
TROCAR XCEL 12X100 BLDLESS (ENDOMECHANICALS) ×2 IMPLANT
TROCAR XCEL BLUNT TIP 100MML (ENDOMECHANICALS) ×2 IMPLANT
TROCAR XCEL NON-BLD 11X100MML (ENDOMECHANICALS) ×2 IMPLANT
TROCAR XCEL NON-BLD 5MMX100MML (ENDOMECHANICALS) ×2 IMPLANT
WATER STERILE IRR 1000ML POUR (IV SOLUTION) IMPLANT

## 2011-05-30 NOTE — Transfer of Care (Signed)
Immediate Anesthesia Transfer of Care Note  Patient: Nathaniel Adkins  Procedure(s) Performed:  LAPAROSCOPIC CHOLECYSTECTOMY WITH INTRAOPERATIVE CHOLANGIOGRAM  Patient Location: PACU  Anesthesia Type: General  Level of Consciousness: lethargic  Airway & Oxygen Therapy: Patient Spontanous Breathing and Patient connected to face mask oxygen  Post-op Assessment: Report given to PACU RN and Post -op Vital signs reviewed and stable  Post vital signs: Reviewed and stable  Complications: No apparent anesthesia complications

## 2011-05-30 NOTE — Anesthesia Preprocedure Evaluation (Addendum)
Anesthesia Evaluation  Patient identified by MRN, date of birth, ID band Patient awake    Reviewed: Allergy & Precautions, H&P , NPO status , Patient's Chart, lab work & pertinent test results, reviewed documented beta blocker date and time   History of Anesthesia Complications Negative for: history of anesthetic complications  Airway       Dental   Pulmonary COPDformer smoker (1 ppd x 30 yrs; quit 40 yrs ago) clear to auscultation        Cardiovascular hypertension, Pt. on medications Regular Normal- Systolic murmurs    Neuro/Psych    GI/Hepatic   Endo/Other    Renal/GU      Musculoskeletal   Abdominal   Peds  Hematology   Anesthesia Other Findings   Reproductive/Obstetrics                          Anesthesia Physical Anesthesia Plan  ASA: III  Anesthesia Plan: General   Post-op Pain Management:    Induction: Intravenous  Airway Management Planned: Oral ETT  Additional Equipment:   Intra-op Plan:   Post-operative Plan:   Informed Consent: I have reviewed the patients History and Physical, chart, labs and discussed the procedure including the risks, benefits and alternatives for the proposed anesthesia with the patient or authorized representative who has indicated his/her understanding and acceptance.   Dental advisory given  Plan Discussed with: CRNA and Surgeon  Anesthesia Plan Comments:         Anesthesia Quick Evaluation

## 2011-05-30 NOTE — Progress Notes (Signed)
Subjective: Feeling better today.  Waiting for surgery today.  Objective: Vital signs in last 24 hours: Filed Vitals:   05/30/11 1205 05/30/11 1215 05/30/11 1220 05/30/11 1230  BP: 161/53  168/63   Pulse:  80  68  Temp: 99.3 F (37.4 C)     TempSrc:      Resp:  19  18  Height:      Weight:      SpO2:  100%  96%   Weight change:   Intake/Output Summary (Last 24 hours) at 05/30/11 1243 Last data filed at 05/30/11 1200  Gross per 24 hour  Intake   1464 ml  Output    325 ml  Net   1139 ml   Physical Exam: General: Awake, Oriented, No acute distress. HEENT: EOMI. slight scleral icterus. Neck: Supple CV: S1 and S2 Lungs: Clear to ascultation bilaterally Abdomen: Soft, Nontender, Nondistended, +bowel sounds. Ext: Good pulses. Trace edema.  Lab Results:  [facility] 05/30/11 0545 05/29/11 0650  NA 140 142  K 3.4* 3.3*  CL 110 112  CO2 23 22  GLUCOSE 85 95  BUN 24* 26*  CREATININE 1.22 1.14  CALCIUM 8.1* 8.3*  MG 2.2 2.3  PHOS -- --    Basename 05/30/11 0545 05/29/11 0650  AST 23 21  ALT 24 27  ALKPHOS 130* 143*  BILITOT 1.2 1.3*  PROT 5.5* 5.6*  ALBUMIN 2.2* 2.2*   No results found for this basename: LIPASE:2,AMYLASE:2 in the last 72 hours  Basename 05/30/11 0545 05/29/11 0650  WBC 7.8 9.9  NEUTROABS -- --  HGB 10.2* 10.4*  HCT 30.1* 31.1*  MCV 80.3 80.8  PLT 146* 134*   No results found for this basename: CKTOTAL:3,CKMB:3,CKMBINDEX:3,TROPONINI:3 in the last 72 hours No results found for this basename: POCBNP:3 in the last 72 hours No results found for this basename: DDIMER:2 in the last 72 hours No results found for this basename: HGBA1C:2 in the last 72 hours No results found for this basename: CHOL:2,HDL:2,LDLCALC:2,TRIG:2,CHOLHDL:2,LDLDIRECT:2 in the last 72 hours No results found for this basename: TSH,T4TOTAL,FREET3,T3FREE,THYROIDAB in the last 72 hours  Basename 05/28/11 0700  VITAMINB12 383  FOLATE 11.4  FERRITIN 344*  TIBC Not calculated  due to Iron <10.  IRON <10*  RETICCTPCT 1.0    Micro Results: Recent Results (from the past 240 hour(s))  URINE CULTURE     Status: Normal   Collection Time   05/26/11  1:31 PM      Component Value Range Status Comment   Specimen Description URINE, CLEAN CATCH   Final    Special Requests NONE   Final    Setup Time [id number]   Final    Colony Count 5,000 COLONIES/ML   Final    Culture INSIGNIFICANT GROWTH   Final    Report Status 05/27/2011 FINAL   Final   CULTURE, BLOOD (ROUTINE X 2)     Status: Normal (Preliminary result)   Collection Time   05/26/11 10:30 PM      Component Value Range Status Comment   Specimen Description BLOOD ARM RIGHT   Final    Special Requests BOTTLES DRAWN AEROBIC AND ANAEROBIC 10CC   Final    Setup Time [id number]   Final    Culture     Final    Value:        BLOOD CULTURE RECEIVED NO GROWTH TO DATE CULTURE WILL BE HELD FOR 5 DAYS BEFORE ISSUING A FINAL NEGATIVE REPORT   Report Status PENDING  Incomplete   CULTURE, BLOOD (ROUTINE X 2)     Status: Normal (Preliminary result)   Collection Time   05/26/11 10:40 PM      Component Value Range Status Comment   Specimen Description BLOOD ARM LEFT   Final    Special Requests BOTTLES DRAWN AEROBIC AND ANAEROBIC 10CC   Final    Setup Time [id number]   Final    Culture     Final    Value:        BLOOD CULTURE RECEIVED NO GROWTH TO DATE CULTURE WILL BE HELD FOR 5 DAYS BEFORE ISSUING A FINAL NEGATIVE REPORT   Report Status PENDING   Incomplete     Medications: I have reviewed the patient's current medications. Scheduled Meds:    . amLODipine  2.5 mg Oral Daily  . pantoprazole (PROTONIX) IV  40 mg Intravenous QHS  . piperacillin-tazobactam (ZOSYN)  IV  3.375 g Intravenous Q8H  . potassium chloride  40 mEq Oral Once   Continuous Infusions:    . sodium chloride 20 mL/hr at 05/30/11 0950   PRN Meds:.acetaminophen, acetaminophen, droperidol, hydrALAZINE, HYDROmorphone, HYDROmorphone, labetalol,  ondansetron (ZOFRAN) IV, ondansetron, simethicone, DISCONTD: bupivacaine-EPINEPHrine, DISCONTD: iohexol, DISCONTD: sodium chloride irrigation  Assessment/Plan: 1. Acute cholecystitis/RUQ abdominal pain.  Continue Zosyn, antibiotic since 05/26/2011.  Had ERCP with sphincterotomy on 05/28/2011.  Plan for laparoscopic cholecystectomy with intraoperative cholangiogram today.  General surgery and GI following.  2. Fever.  Secondary to #1.  Resolved.  3. Jaundice.  Secondary to #1.  Improving.  4. Leukocytosis.  Stable.  5. Hypokalemia.  Replace when necessary.  6. Anemia.  Suggests iron deficiency anemia.  7. Thrombocytopenia likely due to #1.  Monitor for now, continues to improve.  8. Elevated blood pressure now with diagnosis of hypertension.  Spoke with the patient's daughter who indicated that the patient has not been compliant with a home antihypertensive medications.  Blood pressure uncontrolled today.  Patient has not received his low dose amlodipine.  Continue to monitor for now.  9. Prophylax.  SCDs.  10. CODE STATUS.  DO NOT INTUBATE.  Okay with chest compressions and resuscitation.    11.  Disposition.  Pending consider possible discharge tomorrow depending on how patient does postoperatively.   LOS: 4 days  Melonie Germani A, MD 05/30/2011, 12:43 PM

## 2011-05-30 NOTE — Interval H&P Note (Signed)
History and Physical Interval Note:   05/30/2011   8:26 AM   Nathaniel Adkins  has presented today for surgery, with the diagnosis of Cholecystitis  The various methods of treatment have been discussed with the patient and family. After consideration of risks, benefits and other options for treatment, the patient has consented to  Procedure(s): LAPAROSCOPIC CHOLECYSTECTOMY WITH INTRAOPERATIVE CHOLANGIOGRAM as a surgical intervention .  The patients' history has been reviewed, patient examined, no change in status, stable for surgery.  I have reviewed the patients' chart and labs.  Questions were answered to the patient's satisfaction.     Nicholaos Schippers A.  MD

## 2011-05-30 NOTE — Progress Notes (Signed)
  Subjective: NO COMPLAINTS. Has questions about surgery.Questions answered.  Objective: Vital signs in last 24 hours: Temp:  [97.8 F (36.6 C)-98.6 F (37 C)] 97.8 F (36.6 C) (11/20 0545) Pulse Rate:  [60-62] 60  (11/20 0545) Resp:  [18] 18  (11/20 0545) BP: (157-182)/(66-73) 182/73 mmHg (11/20 0545) SpO2:  [98 %] 98 % (11/20 0545) Last BM Date: 05/29/11  Intake/Output from previous day: 11/19 0701 - 11/20 0700 In: 824 [P.O.:480; I.V.:194; IV Piggyback:150] Out: -  Intake/Output this shift:    GI: soft, non-tender; bowel sounds normal; no masses,  no organomegaly Midline scar noted.  Lab Results:   [facility] 05/30/11 0545 05/29/11 0650  WBC 7.8 9.9  HGB 10.2* 10.4*  HCT 30.1* 31.1*  PLT 146* 134*   BMET  Basename 05/30/11 0545 05/29/11 0650  NA 140 142  K 3.4* 3.3*  CL 110 112  CO2 23 22  GLUCOSE 85 95  BUN 24* 26*  CREATININE 1.22 1.14  CALCIUM 8.1* 8.3*   PT/INR No results found for this basename: LABPROT:2,INR:2 in the last 72 hours ABG No results found for this basename: PHART:2,PCO2:2,PO2:2,HCO3:2 in the last 72 hours  Studies/Results: Dg Ercp With Sphincterotomy  05/28/2011  *RADIOLOGY REPORT*  Clinical Data: CBD stones  ERCP  Comparison:  None.  Technique:  Multiple spot images obtained with the fluoroscopic device and submitted for interpretation post-procedure.  Findings: Two fluoroscopic images are submitted.  Total fluoroscopic time of 8 minutes 25 seconds.  Dilatation of the common bile duct with an area of abrupt transition.  There is suggestion of small filling defects within the dilated segment. Subsequent image demonstrates wire extension past the area of transition.  IMPRESSION: ERCP images demonstrating dilated common bile duct.  These images were submitted for radiologic interpretation only. Please see the procedural report for the amount of contrast and the fluoroscopy time utilized.  Original Report Authenticated By: Waneta Martins,  M.D.    Anti-infectives: Anti-infectives     Start     Dose/Rate Route Frequency Ordered Stop   05/26/11 2200   piperacillin-tazobactam (ZOSYN) IVPB 3.375 g        3.375 g 12.5 mL/hr over 240 Minutes Intravenous 3 times per day 05/26/11 2157     05/26/11 2145   piperacillin-tazobactam (ZOSYN) IVPB 3.375 g        3.375 g 12.5 mL/hr over 240 Minutes Intravenous  Once 05/26/11 2133 05/27/11 0155   05/26/11 1715   cefTRIAXone (ROCEPHIN) 1 g in dextrose 5 % 50 mL IVPB        1 g 100 mL/hr over 30 Minutes Intravenous  Once 05/26/11 1711 05/26/11 1846          Assessment/Plan: s/p Procedure(s): LAPAROSCOPIC CHOLECYSTECTOMY WITH INTRAOPERATIVE CHOLANGIOGRAM OR today.  LOS: 4 days    Nathaniel Adkins A. 05/30/2011

## 2011-05-30 NOTE — Anesthesia Procedure Notes (Signed)
Date/Time: 05/30/2011 10:19 AM Performed by: Romie Minus Pre-anesthesia Checklist: Patient identified, Emergency Drugs available, Suction available and Patient being monitored Patient Re-evaluated:Patient Re-evaluated prior to inductionOxygen Delivery Method: Circle System Utilized Preoxygenation: Pre-oxygenation with 100% oxygen Intubation Type: IV induction Ventilation: Two handed mask ventilation required, Oral airway inserted - appropriate to patient size and Mask ventilation without difficulty Laryngoscope Size: Miller and 2 Grade View: Grade I Tube type: Oral Tube size: 7.5 mm Number of attempts: 1 Placement Confirmation: ETT inserted through vocal cords under direct vision,  positive ETCO2 and breath sounds checked- equal and bilateral Tube secured with: Tape Dental Injury: Teeth and Oropharynx as per pre-operative assessment

## 2011-05-30 NOTE — Anesthesia Postprocedure Evaluation (Signed)
Anesthesia Post Note  Patient: Nathaniel Adkins  Procedure(s) Performed:  LAPAROSCOPIC CHOLECYSTECTOMY WITH INTRAOPERATIVE CHOLANGIOGRAM  Anesthesia type: General  Patient location: PACU  Post pain: Pain level controlled  Post assessment: Patient's Cardiovascular Status Stable  Last Vitals:  Filed Vitals:   05/30/11 1245  BP: 177/58  Pulse: 66  Temp: 36.6 C  Resp: 17    Post vital signs: Reviewed and stable  Level of consciousness: sedated  Complications: No apparent anesthesia complications

## 2011-05-30 NOTE — Progress Notes (Signed)
Op note  Reviewed, pt has retained stones on IOC. We have scheduled him for a repeat ERCP and stone removal tomorrow at 1.00 pm by Dr Russella Dar.

## 2011-05-30 NOTE — Preoperative (Signed)
Beta Blockers   Reason not to administer Beta Blockers:Not Applicable 

## 2011-05-30 NOTE — Op Note (Signed)
Preop diagnosis: Acute on chronic cholecystitis with common bile duct stones  Postop diagnosis: Same  Procedure: Laparoscopic cholecystectomy with intraoperative cholangiogram  Surgeon: Harriette Bouillon M.D.  Assistant: Caryn Bee brooding PA  Anesthesia: Gen. endotracheal anesthesia  EBL: 70 cc  Specimen: Gallbladder pathology  Drains: 19 French Blake drain to right upper quadrant  Indications for procedure: The patient is an 77 year old male with common bile duct stones who underwent ERCP and sphincterotomy. There is some indication that he had retained common duct stones per ERCP. I felt that laparoscopic cholecystectomy with intraoperative cholangiogram was necessary and could further evaluate his anatomy since they were unable to visualize the upper common hepatic duct. Risks, benefits and alternative therapies were discussed with the patient preoperatively and these are outlined in my note. Questions are answered. The patient agreed to proceed.  Description of procedure: The patient was met in the holding area and questions are answered. He is taken back to the operating room. After induction of general endotracheal anesthesia his abdomen was prepped and draped in a sterile fashion and timeout was done. He was on preoperative antibiotics. He had a previous laparotomy incision. A 5 mm port was placed the right upper quadrant under direct visualization using the scope. We entered the right upper quadrant without difficulty and pneumoperitoneum was created. She had multiple intra-abdominal adhesions. No evidence of bowel injury with this port. We inserted 3 other 5 mm ports in each her place in the right upper quadrant, right midabdomen, left midabdomen. The gallbladder is distended. He was decompressed. He was retracted the patient's right shoulder. A second graspers used and this pulled the lower portion of the gallbladder toward the right lower quadrant. The cystic duct was identified and  dissected out circumferentially. The critical angle was achieved. 2 clips are placed on the gallbladder side of the cystic duct. Small incision was made in the cystic duct. Through separate incision a Cook cholangiogram catheter was introduced and placed into the cystic duct. Intraoperative cholangiogram was performed. The common bile duct was dilated. There is obstruction distal to the cystic duct. The common hepatic duct bifurcated into the right and left hepatic ducts and these are dilated. Contrast went below the cystic duct but stopped in the mid common bile duct. There appeared to be stones here. No contrast passed into the duodenum. I felt that repeat ERCP should be attempted. The catheter was then removed from the cystic duct this was clipped and divided. The cystic artery was clipped and divided. The posterior branch of the cystic artery was clipped and divided. Cautery was used to dissect the gallbladder from the gallbladder bed. The gallbladder was placed in an Endo Catch bag. The gallbladder bed was cauterized for hemostasis. A 19 round Blake drain was placed in the gallbladder fossa. The gallbladder was extracted by placing a 10 mm port in the left upper quadrant replacing one of the 5 mm ports. The gallbladder was extracted. The fascia was closed with a 0 Vicryl suture and a suture passer. Hemostasis was excellent. No evidence of soft bowel injury. The drain was brought out to the midabdomen the port site on the right and secured to the skin with 2-0 nylon. The ports were then removed allowing the CO2 to escape. The skin was closed with 4-0 Monocryl. Dermabond applied. All final counts sponge, needles and instruments were correct. The patient was extubated taken to recovery in satisfactory condition.

## 2011-05-31 ENCOUNTER — Inpatient Hospital Stay (HOSPITAL_COMMUNITY): Payer: Medicare Other

## 2011-05-31 ENCOUNTER — Encounter (HOSPITAL_COMMUNITY)
Admission: EM | Disposition: A | Discharge status: Home / Self Care | Payer: Self-pay | Source: Home / Self Care | Attending: Internal Medicine

## 2011-05-31 ENCOUNTER — Encounter (HOSPITAL_COMMUNITY): Payer: Self-pay | Admitting: Surgery

## 2011-05-31 HISTORY — PX: ERCP: SHX5425

## 2011-05-31 LAB — BASIC METABOLIC PANEL
BUN: 24 mg/dL — ABNORMAL HIGH (ref 6–23)
CO2: 23 mEq/L (ref 19–32)
Calcium: 8.3 mg/dL — ABNORMAL LOW (ref 8.4–10.5)
Creatinine, Ser: 1.31 mg/dL (ref 0.50–1.35)
GFR calc Af Amer: 54 mL/min — ABNORMAL LOW (ref >90)

## 2011-05-31 LAB — CBC
HCT: 31.2 % — ABNORMAL LOW (ref 39.0–52.0)
MCH: 26.3 pg (ref 26.0–34.0)
MCV: 81.3 fL (ref 78.0–100.0)
Platelets: 185 10*3/uL (ref 150–400)
RDW: 17.3 % — ABNORMAL HIGH (ref 11.5–15.5)

## 2011-05-31 SURGERY — ERCP, WITH INTERVENTION IF INDICATED
Anesthesia: Moderate Sedation

## 2011-05-31 MED ORDER — SODIUM CHLORIDE 0.9 % IV SOLN
INTRAVENOUS | Status: AC
  Administered 2011-05-31: 13:00:00 via INTRAVENOUS

## 2011-05-31 MED ORDER — BUTAMBEN-TETRACAINE-BENZOCAINE 2-2-14 % EX AERO
INHALATION_SPRAY | CUTANEOUS | Status: DC | PRN
  Administered 2011-05-31: 1 via TOPICAL

## 2011-05-31 MED ORDER — MIDAZOLAM HCL 10 MG/2ML IJ SOLN
INTRAMUSCULAR | Status: AC
  Filled 2011-05-31: qty 4

## 2011-05-31 MED ORDER — GLUCAGON HCL (RDNA) 1 MG IJ SOLR
INTRAMUSCULAR | Status: AC
  Filled 2011-05-31: qty 2

## 2011-05-31 MED ORDER — SODIUM CHLORIDE 0.9 % IJ SOLN
INTRAMUSCULAR | Status: DC | PRN
  Administered 2011-05-31: 14:00:00 via INTRAMUSCULAR

## 2011-05-31 MED ORDER — MIDAZOLAM HCL 10 MG/2ML IJ SOLN
INTRAMUSCULAR | Status: DC | PRN
  Administered 2011-05-31 (×2): 2 mg via INTRAVENOUS
  Administered 2011-05-31: 1 mg via INTRAVENOUS

## 2011-05-31 MED ORDER — FENTANYL CITRATE 0.05 MG/ML IJ SOLN
INTRAMUSCULAR | Status: AC
  Filled 2011-05-31: qty 4

## 2011-05-31 MED ORDER — FENTANYL CITRATE 0.05 MG/ML IJ SOLN
INTRAMUSCULAR | Status: DC | PRN
  Administered 2011-05-31 (×2): 25 ug via INTRAVENOUS

## 2011-05-31 MED ORDER — GLUCAGON HCL (RDNA) 1 MG IJ SOLR
INTRAMUSCULAR | Status: DC | PRN
  Administered 2011-05-31 (×2): .25 mg via INTRAVENOUS

## 2011-05-31 NOTE — Interval H&P Note (Signed)
History and Physical Interval Note:    05/31/2011   1:27 PM   Nathaniel Adkins  has presented today for surgery, with the diagnosis of choledocholithiasis  The various methods of treatment have been discussed with the patient and family. After consideration of risks, benefits and other options for treatment, the patient has consented to  Procedure(s): ENDOSCOPIC RETROGRADE CHOLANGIOPANCREATOGRAPHY (ERCP) as a surgical intervention .  The patients' history has been reviewed, patient examined, no change in status, stable for surgery.  I have reviewed the patients' chart and labs.  Questions were answered to the patient's satisfaction.     Venita Lick. Russella Dar  MD

## 2011-05-31 NOTE — Progress Notes (Signed)
1 Day Post-Op  Subjective: Pt ok, mildly sore at drain site, otherwise ok. No N/V.  Objective: Vital signs in last 24 hours: Temp:  [96.9 F (36.1 C)-100.6 F (38.1 C)] 98.8 F (37.1 C) (11/21 0550) Pulse Rate:  [58-104] 58  (11/21 0550) Resp:  [16-20] 18  (11/21 0550) BP: (140-187)/(45-87) 146/58 mmHg (11/21 0550) SpO2:  [92 %-100 %] 94 % (11/21 0550) Last BM Date: 05/29/11  Intake/Output this shift:    Physical Exam: Lungs: CTA without w/r/r Heart: Regular Abdomen: soft, ND, appropriately tender   Incisions all c/d/i without erythema or hematoma.   Drain intact, serous output, no bile Ext: No edema or tenderness   Labs: CBC  Basename 05/31/11 0645 05/30/11 0545  WBC 12.0* 7.8  HGB 10.1* 10.2*  HCT 31.2* 30.1*  PLT 185 146*   BMET  Basename 05/31/11 0645 05/30/11 0545  NA 138 140  K 4.0 3.4*  CL 108 110  CO2 23 23  GLUCOSE 147* 85  BUN 24* 24*  CREATININE 1.31 1.22  CALCIUM 8.3* 8.1*   LFT  Basename 05/30/11 0545  PROT 5.5*  ALBUMIN 2.2*  AST 23  ALT 24  ALKPHOS 130*  BILITOT 1.2  BILIDIR --  IBILI --  LIPASE --   PT/INR No results found for this basename: LABPROT:2,INR:2 in the last 72 hours ABG No results found for this basename: PHART:2,PCO2:2,PO2:2,HCO3:2 in the last 72 hours  Studies/Results: No results found.  Assessment: Principal Problem:  *Acute cholecystitis Active Problems:  RUQ abdominal pain  Fever  Jaundice  Leukocytosis  Hypokalemia  Anemia s/p Procedure(s): LAPAROSCOPIC CHOLECYSTECTOMY WITH INTRAOPERATIVE CHOLANGIOGRAM Plan: Repeat ERCP today for retained CBD stones.  LOS: 5 days    Marianna Fuss 05/31/2011

## 2011-05-31 NOTE — Progress Notes (Signed)
S/p lap chole, retained CBD stones on IOC, for ERCP at 1.00pm today, Dr Russella Dar, ,possibly home tomorrow. DB

## 2011-05-31 NOTE — Progress Notes (Signed)
Subjective: Just returned from ERCP, feels very drowsy.  Objective: Vital signs in last 24 hours: Temp:  [98.2 F (36.8 C)-100.6 F (38.1 C)] 98.2 F (36.8 C) (11/21 1601) Pulse Rate:  [58-88] 61  (11/21 1601) Resp:  [10-64] 20  (11/21 1601) BP: (112-177)/(54-89) 130/55 mmHg (11/21 1601) SpO2:  [92 %-100 %] 92 % (11/21 1601) Weight change:  Last BM Date: 05/30/11  Intake/Output from previous day: 11/20 0701 - 11/21 0700 In: 2550 [P.O.:120; I.V.:2280; IV Piggyback:150] Out: 655 [Urine:525; Drains:50; Blood:80]     Physical Exam: General: Alert, awake, oriented x3, in no acute distress. HEENT: No bruits, no goiter. Heart: Regular rate and rhythm, without murmurs, rubs, gallops. Lungs: Clear to auscultation bilaterally. Abdomen: Soft, nontender, nondistended, positive bowel sounds. Extremities: No clubbing cyanosis or edema with positive pedal pulses. Neuro: Grossly intact, nonfocal.    Lab Results: Basic Metabolic Panel:  Basename 05/31/11 0645 05/30/11 0545 05/29/11 0650  NA 138 140 --  K 4.0 3.4* --  CL 108 110 --  CO2 23 23 --  GLUCOSE 147* 85 --  BUN 24* 24* --  CREATININE 1.31 1.22 --  CALCIUM 8.3* 8.1* --  MG -- 2.2 2.3  PHOS -- -- --   Liver Function Tests:  [facility] 05/30/11 0545 05/29/11 0650  AST 23 21  ALT 24 27  ALKPHOS 130* 143*  BILITOT 1.2 1.3*  PROT 5.5* 5.6*  ALBUMIN 2.2* 2.2*   CBC:  Basename 05/31/11 0645 05/30/11 0545  WBC 12.0* 7.8  NEUTROABS -- --  HGB 10.1* 10.2*  HCT 31.2* 30.1*  MCV 81.3 80.3  PLT 185 146*    Recent Results (from the past 240 hour(s))  URINE CULTURE     Status: Normal   Collection Time   05/26/11  1:31 PM      Component Value Range Status Comment   Specimen Description URINE, CLEAN CATCH   Final    Special Requests NONE   Final    Setup Time [id number]   Final    Colony Count 5,000 COLONIES/ML   Final    Culture INSIGNIFICANT GROWTH   Final    Report Status 05/27/2011 FINAL   Final   CULTURE,  BLOOD (ROUTINE X 2)     Status: Normal (Preliminary result)   Collection Time   05/26/11 10:30 PM      Component Value Range Status Comment   Specimen Description BLOOD ARM RIGHT   Final    Special Requests BOTTLES DRAWN AEROBIC AND ANAEROBIC 10CC   Final    Setup Time [id number]   Final    Culture     Final    Value:        BLOOD CULTURE RECEIVED NO GROWTH TO DATE CULTURE WILL BE HELD FOR 5 DAYS BEFORE ISSUING A FINAL NEGATIVE REPORT   Report Status PENDING   Incomplete   CULTURE, BLOOD (ROUTINE X 2)     Status: Normal (Preliminary result)   Collection Time   05/26/11 10:40 PM      Component Value Range Status Comment   Specimen Description BLOOD ARM LEFT   Final    Special Requests BOTTLES DRAWN AEROBIC AND ANAEROBIC 10CC   Final    Setup Time [id number]   Final    Culture     Final    Value:        BLOOD CULTURE RECEIVED NO GROWTH TO DATE CULTURE WILL BE HELD FOR 5 DAYS BEFORE ISSUING A FINAL NEGATIVE REPORT   Report  Status PENDING   Incomplete     Studies/Results: Dg Cholangiogram Operative  05/31/2011  *RADIOLOGY REPORT*  Clinical Data:   Cholelithiasis, choledocholithiasis  INTRAOPERATIVE CHOLANGIOGRAM  Technique:  Cholangiographic images from the C-arm fluoroscopic device were submitted for interpretation post-operatively.  Please see the procedural report for the amount of contrast and the fluoroscopy time utilized.  Comparison:  ERCP 05/29/2011  Findings: There are persistent filling defects in the central left and right intrahepatic bile ducts as well as in the common bile duct.  There are occlusive stones in the distal CBD.  No flow seen into the duodenum.  There is incomplete visualization of the intrahepatic biliary tree, which is unremarkable.  IMPRESSION  1.  Obstructing choledocholithiasis.  Original Report Authenticated By: Osa Craver, M.D.    Medications: Scheduled Meds:   . amLODipine  2.5 mg Oral Daily  . pantoprazole (PROTONIX) IV  40 mg Intravenous  QHS  . piperacillin-tazobactam (ZOSYN)  IV  3.375 g Intravenous Q8H  . potassium chloride  40 mEq Oral Once  . DISCONTD: ampicillin-sulbactam (UNASYN) IV  1.5 g Intravenous Once   Continuous Infusions:   . sodium chloride 50 mL/hr (05/31/11 1425)  . dextrose 5 % and 0.45 % NaCl with KCl 10 mEq/L 100 mL/hr at 05/31/11 0905   PRN Meds:.acetaminophen, droperidol, HYDROmorphone, HYDROmorphone (DILAUDID) injection, labetalol, ondansetron (ZOFRAN) IV, ondansetron, simethicone, DISCONTD: 10 mL Omnipaque 300 mg/mL mixed with 0.9% normal saline (10 mL) injection, DISCONTD: butamben-tetracaine-benzocaine, DISCONTD: fentaNYL, DISCONTD: glucagon, DISCONTD: midazolam  Assessment/Plan:  Principal Problem:  *Acute cholecystitis Active Problems:  RUQ abdominal pain  Fever  Jaundice  Leukocytosis  Hypokalemia  Anemia  #1 acute cholecystitis: Had laparoscopic cholecystectomy yesterday. IOC showed evidence for retained stones. GI has performed an ERCP today and a biliary stent has been left in place. We'll recheck LFT's and WBC count in the morning. Continue Zosyn for now, but suspect will not need antibiotics for home. Plan for discharge home in the morning as long as patient is doing well. This plan has been discussed with surgery and with patient's daughter Britta Mccreedy.   LOS: 5 days   HERNANDEZ ACOSTA,ESTELA 05/31/2011, 4:10 PM

## 2011-05-31 NOTE — Progress Notes (Signed)
The patient was seen, examined and PA note reviewed and data reviewed.  I agree with the plan of action. 

## 2011-06-01 LAB — CBC
HCT: 31.2 % — ABNORMAL LOW (ref 39.0–52.0)
Hemoglobin: 10.3 g/dL — ABNORMAL LOW (ref 13.0–17.0)
MCH: 27 pg (ref 26.0–34.0)
MCHC: 33 g/dL (ref 30.0–36.0)
MCV: 81.7 fL (ref 78.0–100.0)
RDW: 17.2 % — ABNORMAL HIGH (ref 11.5–15.5)

## 2011-06-01 LAB — COMPREHENSIVE METABOLIC PANEL
ALT: 27 U/L (ref 0–53)
AST: 35 U/L (ref 0–37)
Alkaline Phosphatase: 113 U/L (ref 39–117)
CO2: 23 mEq/L (ref 19–32)
Calcium: 7.9 mg/dL — ABNORMAL LOW (ref 8.4–10.5)
Glucose, Bld: 123 mg/dL — ABNORMAL HIGH (ref 70–99)
Potassium: 3.5 mEq/L (ref 3.5–5.1)
Sodium: 138 mEq/L (ref 135–145)
Total Protein: 5.2 g/dL — ABNORMAL LOW (ref 6.0–8.3)

## 2011-06-01 MED ORDER — AMLODIPINE BESYLATE 2.5 MG PO TABS: 2.5000 mg | ORAL_TABLET | Freq: Every day | ORAL | Status: DC

## 2011-06-01 NOTE — Progress Notes (Signed)
S/p ERCP and removal of retained CBD stones. Doing well. Labs are all normal. OK to discharge from GI standpoint. Biliary stent will have to be removed in 4 weeks, will arrange for appointment with Dr Russella Dar. ( we will call him since the office is closed today). DBrodie

## 2011-06-01 NOTE — Progress Notes (Signed)
1 Day Post-Op  Subjective: Pt ok. Feels ok. Tol diet. Minimal pain/soreness. No N/V Voiding fine.  Objective: Vital signs in last 24 hours: Temp:  [98.2 F (36.8 C)-100.1 F (37.8 C)] 100.1 F (37.8 C) (11/22 0650) Pulse Rate:  [61-66] 66  (11/22 0650) Resp:  [10-64] 20  (11/22 0650) BP: (126-173)/(54-89) 171/65 mmHg (11/22 0650) SpO2:  [92 %-100 %] 97 % (11/22 0650) Last BM Date: 05/30/11  Intake/Output this shift:    Physical Exam: Abdomen: soft, ND. Incisions all c/d/i Drain intact, serous output, no bile. Removed at bedside.  Labs: CBC  Basename 06/01/11 0720 05/31/11 0645  WBC 11.1* 12.0*  HGB 10.3* 10.1*  HCT 31.2* 31.2*  PLT 189 185   BMET  Basename 06/01/11 0720 05/31/11 0645  NA 138 138  K 3.5 4.0  CL 109 108  CO2 23 23  GLUCOSE 123* 147*  BUN 17 24*  CREATININE 1.08 1.31  CALCIUM 7.9* 8.3*   LFT  Basename 06/01/11 0720  PROT 5.2*  ALBUMIN 2.1*  AST 35  ALT 27  ALKPHOS 113  BILITOT 1.1  BILIDIR --  IBILI --  LIPASE --   PT/INR No results found for this basename: LABPROT:2,INR:2 in the last 72 hours ABG No results found for this basename: PHART:2,PCO2:2,PO2:2,HCO3:2 in the last 72 hours  Studies/Results: Dg Cholangiogram Operative  05/31/2011  *RADIOLOGY REPORT*  Clinical Data:   Cholelithiasis, choledocholithiasis  INTRAOPERATIVE CHOLANGIOGRAM  Technique:  Cholangiographic images from the C-arm fluoroscopic device were submitted for interpretation post-operatively.  Please see the procedural report for the amount of contrast and the fluoroscopy time utilized.  Comparison:  ERCP 05/29/2011  Findings: There are persistent filling defects in the central left and right intrahepatic bile ducts as well as in the common bile duct.  There are occlusive stones in the distal CBD.  No flow seen into the duodenum.  There is incomplete visualization of the intrahepatic biliary tree, which is unremarkable.  IMPRESSION  1.  Obstructing  choledocholithiasis.  Original Report Authenticated By: Osa Craver, M.D.    Assessment: Principal Problem:  *Acute cholecystitis Active Problems:  RUQ abdominal pain  Fever  Jaundice  Leukocytosis  Hypokalemia  Anemia  s/p Procedure(s): ENDOSCOPIC RETROGRADE CHOLANGIOPANCREATOGRAPHY (ERCP) Plan: Drain removed. OK for DC Followup and instructions left.  LOS: 6 days    Nathaniel Adkins 06/01/2011

## 2011-06-01 NOTE — Discharge Summary (Signed)
  Physician Discharge Summary  Patient ID: Nathaniel Adkins MRN: [id number] DOB/AGE: Jul 09, 1920 77 y.o.  Admit date: 05/26/2011 Discharge date: 06/01/2011  Primary Care Physician:  No primary provider on file.   Discharge Diagnoses:    Principal Problem:  *Acute cholecystitis Active Problems:  RUQ abdominal pain  Fever  Jaundice  Leukocytosis  Hypokalemia  Anemia    Current Discharge Medication List    START taking these medications   Details  amLODipine (NORVASC) 2.5 MG tablet Take 1 tablet (2.5 mg total) by mouth daily. Qty: 30 tablet, Refills: 1      STOP taking these medications     aspirin EC 325 MG tablet          Disposition and Follow-up:  Patient will be discharged home today in stable and improved condition. He does not require any further antibiotic therapy. He already has followup scheduled with surgery for December 4.  Consults:  general surgery , GI   Significant Diagnostic Studies:  Dg Cholangiogram Operative  05/31/2011  *RADIOLOGY REPORT*  Clinical Data:   Cholelithiasis, choledocholithiasis  INTRAOPERATIVE CHOLANGIOGRAM  Technique:  Cholangiographic images from the C-arm fluoroscopic device were submitted for interpretation post-operatively.  Please see the procedural report for the amount of contrast and the fluoroscopy time utilized.  Comparison:  ERCP 05/29/2011  Findings: There are persistent filling defects in the central left and right intrahepatic bile ducts as well as in the common bile duct.  There are occlusive stones in the distal CBD.  No flow seen into the duodenum.  There is incomplete visualization of the intrahepatic biliary tree, which is unremarkable.  IMPRESSION  1.  Obstructing choledocholithiasis.  Original Report Authenticated By: Osa Craver, M.D.    Brief H and P: For complete details please refer to admission H and P, but in brief patient is a pleasant 77 year old white gentleman admitted to the hospital with  complaints of right upper quadrant abdominal pain and fever. He was diagnosed with acute cholecystitis, we are asked to admit him for further evaluation and management.    Hospital Course:  Principal Problem:  *Acute cholecystitis Active Problems:  RUQ abdominal pain  Fever  Jaundice  Leukocytosis  Hypokalemia  Anemia  #1 acute cholecystitis: He had a laparoscopic cholecystectomy performed by [facility] surgery. Unfortunately during surgery the IOC showed some retained stones so GI was consulted who performed an ERCP yesterday November 21. A biliary stent was left in place. The stent will need to be removed in about 4 weeks. He has followup already scheduled both with surgery and GI. He does not require any further antibiotics at time of discharge.  Time spent on Discharge: Greater than 30 minutes.  SignedChaya Jan 06/01/2011, 10:36 AM

## 2011-06-02 ENCOUNTER — Encounter (HOSPITAL_COMMUNITY): Payer: Self-pay | Admitting: Gastroenterology

## 2011-06-02 LAB — CULTURE, BLOOD (ROUTINE X 2)
Culture  Setup Time: 201211170327
Culture: NO GROWTH

## 2011-06-06 ENCOUNTER — Encounter (HOSPITAL_COMMUNITY): Payer: Self-pay | Admitting: Gastroenterology

## 2011-06-13 ENCOUNTER — Encounter (INDEPENDENT_AMBULATORY_CARE_PROVIDER_SITE_OTHER): Payer: Self-pay

## 2011-06-13 ENCOUNTER — Ambulatory Visit (INDEPENDENT_AMBULATORY_CARE_PROVIDER_SITE_OTHER): Payer: Medicare Other | Admitting: General Surgery

## 2011-06-13 VITALS — BP 132/64 | Resp 14 | Ht 72.0 in | Wt 156.0 lb

## 2011-06-13 DIAGNOSIS — K8010 Calculus of gallbladder with chronic cholecystitis without obstruction: Secondary | ICD-10-CM

## 2011-06-13 NOTE — Patient Instructions (Signed)
Call if you have any problems 

## 2011-06-13 NOTE — Progress Notes (Signed)
Nathaniel Adkins 1920-05-21 [id number] 06/13/2011   Nathaniel Adkins is a 77 y.o. male who had a laparoscopic cholecystectomy with intraoperative cholangiogram.  The pathology report confirmed Chronic cholecystitis and cholelithiasis   The patient reports that they are feeling well with normal bowel movements and good appetite.  The pre-operative symptoms of abdominal pain, nausea, and vomiting have resolved.    Physical examination - Incisions appear well-healed with no sign of infection or bleeding.   Abdomen - soft, non-tender  Impression:  s/p laparoscopic cholecystectomy  Plan:  He may resume a regular diet and full activity.  He may follow-up on a PRN basis.

## 2011-08-22 ENCOUNTER — Telehealth (INDEPENDENT_AMBULATORY_CARE_PROVIDER_SITE_OTHER): Payer: Self-pay

## 2011-08-22 NOTE — Telephone Encounter (Signed)
Pt's daughter calling to locate the GI md that placed pts stent. Per Pts d/c instructions pt is to f/u with GI MD for stent removal. I gave her the phone # for Dr Russella Dar.

## 2011-08-23 ENCOUNTER — Telehealth: Payer: Self-pay | Admitting: Gastroenterology

## 2011-08-23 DIAGNOSIS — R6889 Other general symptoms and signs: Secondary | ICD-10-CM

## 2011-08-23 DIAGNOSIS — T85520A Displacement of bile duct prosthesis, initial encounter: Secondary | ICD-10-CM

## 2011-08-23 DIAGNOSIS — K8309 Other cholangitis: Secondary | ICD-10-CM

## 2011-08-23 DIAGNOSIS — R69 Illness, unspecified: Secondary | ICD-10-CM

## 2011-08-23 NOTE — Telephone Encounter (Signed)
Discussed with Dr Russella Dar patient needs labs and x-rays tomorrow and ERCP for 8:30 Friday morning at [facility]  She is advised to have the patient be NPO and arrive at 7:30 in outpatient registration. She is advised that we will call her if the stent is no longer in place to cancel.

## 2011-08-23 NOTE — Telephone Encounter (Signed)
Addended by: Annett Fabian on: 08/23/2011 03:29 PM   Modules accepted: Orders

## 2011-08-24 ENCOUNTER — Other Ambulatory Visit: Payer: Self-pay

## 2011-08-24 ENCOUNTER — Other Ambulatory Visit (INDEPENDENT_AMBULATORY_CARE_PROVIDER_SITE_OTHER): Payer: Medicare Other

## 2011-08-24 ENCOUNTER — Ambulatory Visit (INDEPENDENT_AMBULATORY_CARE_PROVIDER_SITE_OTHER)
Admission: RE | Admit: 2011-08-24 | Discharge: 2011-08-24 | Disposition: A | Discharge status: Home / Self Care | Payer: Medicare Other | Source: Ambulatory Visit | Attending: Gastroenterology | Admitting: Gastroenterology

## 2011-08-24 DIAGNOSIS — T85520A Displacement of bile duct prosthesis, initial encounter: Secondary | ICD-10-CM

## 2011-08-24 DIAGNOSIS — T85898A Other specified complication of other internal prosthetic devices, implants and grafts, initial encounter: Secondary | ICD-10-CM

## 2011-08-24 DIAGNOSIS — K8309 Other cholangitis: Secondary | ICD-10-CM

## 2011-08-24 LAB — CBC WITH DIFFERENTIAL/PLATELET
Basophils Relative: 0.7 % (ref 0.0–3.0)
Eosinophils Absolute: 0.2 10*3/uL (ref 0.0–0.7)
Lymphocytes Relative: 30.7 % (ref 12.0–46.0)
MCHC: 32.7 g/dL (ref 30.0–36.0)
Monocytes Relative: 7.4 % (ref 3.0–12.0)
Neutrophils Relative %: 58.2 % (ref 43.0–77.0)
RBC: 4.44 Mil/uL (ref 4.22–5.81)
WBC: 5.4 10*3/uL (ref 4.5–10.5)

## 2011-08-24 LAB — COMPREHENSIVE METABOLIC PANEL
AST: 30 U/L (ref 0–37)
Albumin: 4 g/dL (ref 3.5–5.2)
Alkaline Phosphatase: 79 U/L (ref 39–117)
BUN: 18 mg/dL (ref 6–23)
Creatinine, Ser: 1.3 mg/dL (ref 0.4–1.5)
Glucose, Bld: 121 mg/dL — ABNORMAL HIGH (ref 70–99)
Total Bilirubin: 0.6 mg/dL (ref 0.3–1.2)

## 2011-08-24 LAB — PROTIME-INR
INR: 1 ratio (ref 0.8–1.0)
Prothrombin Time: 11.1 s (ref 10.2–12.4)

## 2011-08-24 LAB — APTT: aPTT: 29.2 s — ABNORMAL HIGH (ref 21.7–28.8)

## 2011-08-25 ENCOUNTER — Ambulatory Visit (HOSPITAL_COMMUNITY): Admission: RE | Admit: 2011-08-25 | Payer: Medicare Other | Source: Ambulatory Visit | Admitting: Gastroenterology

## 2011-08-25 ENCOUNTER — Encounter (HOSPITAL_COMMUNITY): Admission: RE | Payer: Self-pay | Source: Ambulatory Visit

## 2011-08-25 SURGERY — ERCP, WITH INTERVENTION IF INDICATED
Anesthesia: Moderate Sedation

## 2012-04-14 IMAGING — RF DG ERCP WO/W SPHINCTEROTOMY
1 series · 2 of 2 positions shown · non-contrast
Comparison: None.

CLINICAL DATA: CBD stones

ERCP
TECHNIQUE: Multiple spot images obtained with the fluoroscopic
device and submitted for interpretation post-procedure.

[Series 1: run · 2 of 2 slices shown]
[im 1/2]
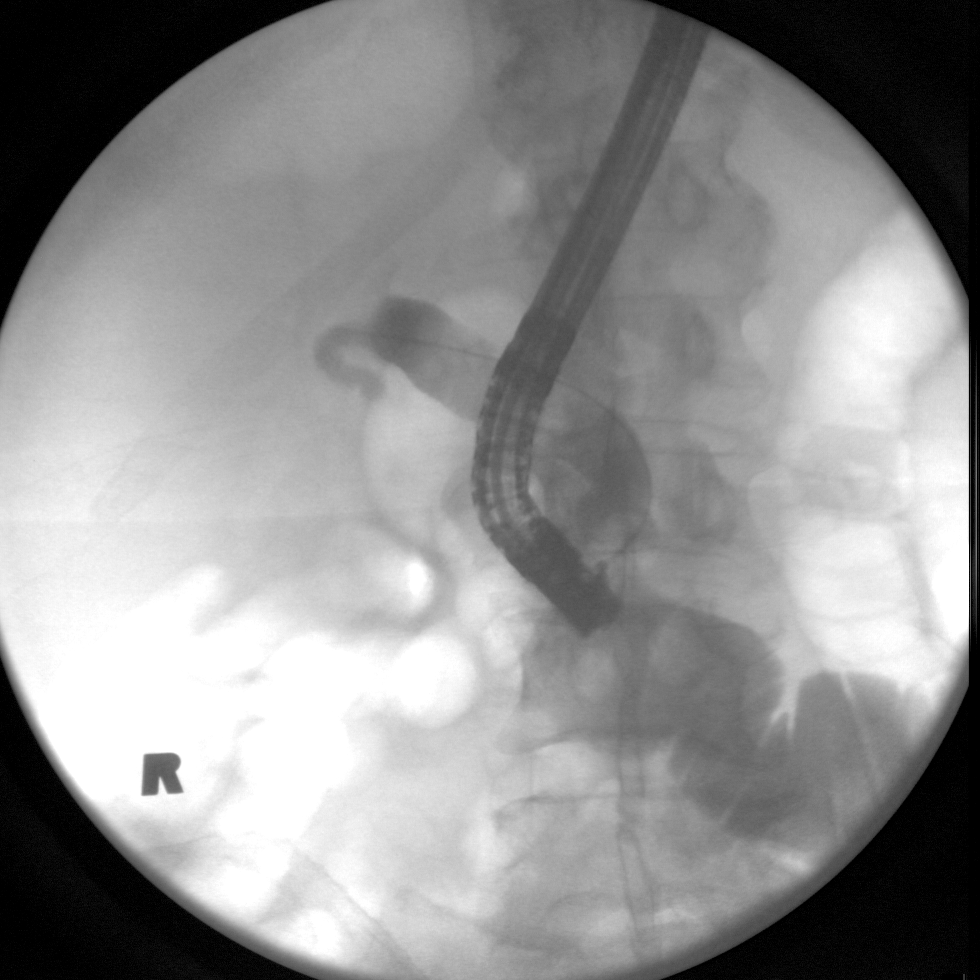
[im 2/2]
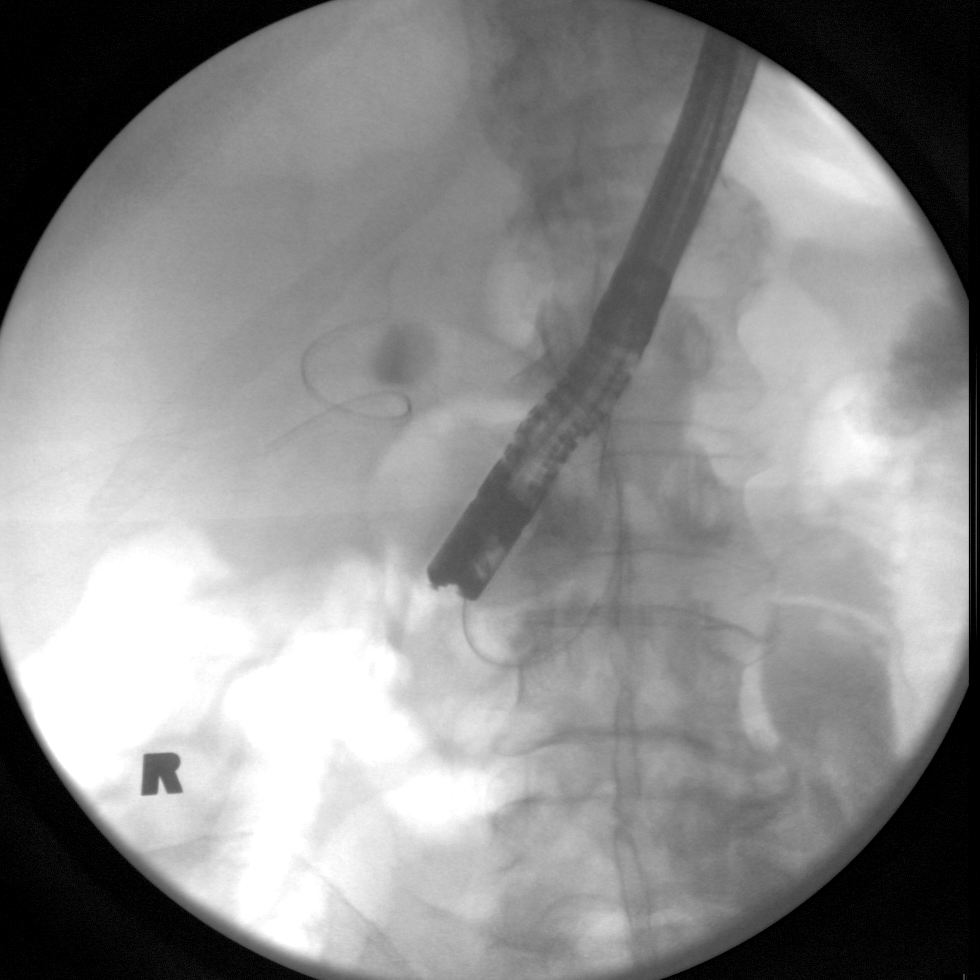

[2 of 2 positions shown; findings below may reference images not displayed]

FINDINGS: Two fluoroscopic images are submitted.  Total
fluoroscopic time of 8 minutes 25 seconds.  Dilatation of the
common bile duct with an area of abrupt transition.  There is
suggestion of small filling defects within the dilated segment.
Subsequent image demonstrates wire extension past the area of
transition.
IMPRESSION: ERCP images demonstrating dilated common bile duct.

These images were submitted for radiologic interpretation only.
Please see the procedural report for the amount of contrast and the
fluoroscopy time utilized.

## 2012-07-11 IMAGING — CR DG ABDOMEN 1V
1 series · 1 of 1 positions shown · non-contrast
Comparison: ERCP images 06/01/2011.  Abdominal series 05/26/2011.

CLINICAL DATA: 89-year-old male with history of biliary stent
placement.
Planned follow-up ERCP.

ABDOMEN - 1 VIEW

[view not recorded]
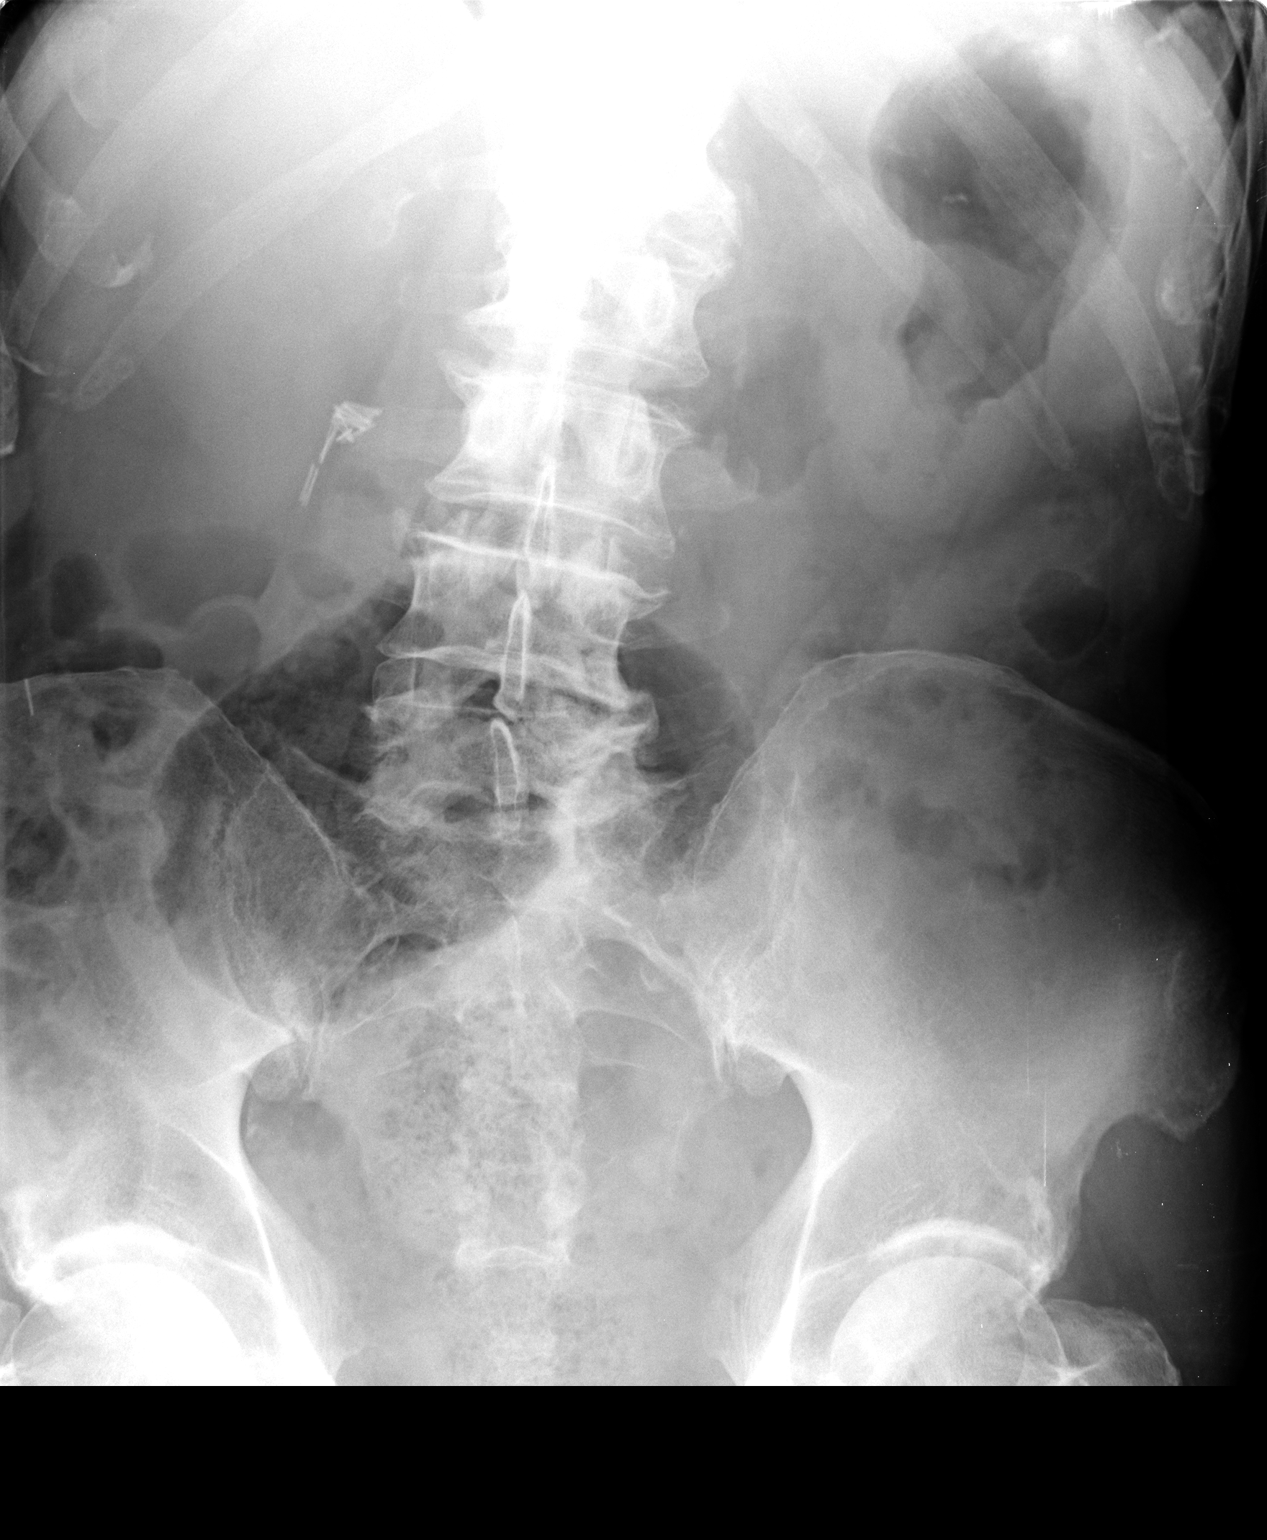

[1 of 1 positions shown; findings below may reference images not displayed]

FINDINGS: Right upper quadrant surgical clips.  No biliary stent
identified. Visualized bowel gas pattern is nonobstructed.
Retained stool in the rectum.  Scoliosis and degenerative spinal
changes. No acute osseous abnormality identified.
IMPRESSION: Sequelae of cholecystectomy.  No biliary stent identified.
Visualized bowel gas pattern is nonobstructed.

## 2013-04-12 ENCOUNTER — Emergency Department (HOSPITAL_COMMUNITY)
Admission: EM | Admit: 2013-04-12 | Discharge: 2013-04-12 | Disposition: A | Discharge status: Home / Self Care | Payer: Medicare Other | Attending: Emergency Medicine | Admitting: Emergency Medicine

## 2013-04-12 ENCOUNTER — Encounter (HOSPITAL_COMMUNITY): Payer: Self-pay | Admitting: *Deleted

## 2013-04-12 DIAGNOSIS — R45850 Homicidal ideations: Secondary | ICD-10-CM | POA: Insufficient documentation

## 2013-04-12 DIAGNOSIS — Z8669 Personal history of other diseases of the nervous system and sense organs: Secondary | ICD-10-CM | POA: Insufficient documentation

## 2013-04-12 DIAGNOSIS — Z8719 Personal history of other diseases of the digestive system: Secondary | ICD-10-CM | POA: Insufficient documentation

## 2013-04-12 DIAGNOSIS — I10 Essential (primary) hypertension: Secondary | ICD-10-CM | POA: Insufficient documentation

## 2013-04-12 DIAGNOSIS — Z862 Personal history of diseases of the blood and blood-forming organs and certain disorders involving the immune mechanism: Secondary | ICD-10-CM | POA: Insufficient documentation

## 2013-04-12 DIAGNOSIS — R4182 Altered mental status, unspecified: Secondary | ICD-10-CM | POA: Insufficient documentation

## 2013-04-12 DIAGNOSIS — J449 Chronic obstructive pulmonary disease, unspecified: Secondary | ICD-10-CM | POA: Insufficient documentation

## 2013-04-12 DIAGNOSIS — Z87891 Personal history of nicotine dependence: Secondary | ICD-10-CM | POA: Insufficient documentation

## 2013-04-12 DIAGNOSIS — J4489 Other specified chronic obstructive pulmonary disease: Secondary | ICD-10-CM | POA: Insufficient documentation

## 2013-04-12 LAB — RAPID URINE DRUG SCREEN, HOSP PERFORMED
Barbiturates: NOT DETECTED
Benzodiazepines: NOT DETECTED
Cocaine: NOT DETECTED
Tetrahydrocannabinol: NOT DETECTED

## 2013-04-12 LAB — CBC WITH DIFFERENTIAL/PLATELET
Eosinophils Absolute: 0 10*3/uL (ref 0.0–0.7)
HCT: 36.6 % — ABNORMAL LOW (ref 39.0–52.0)
Hemoglobin: 12.2 g/dL — ABNORMAL LOW (ref 13.0–17.0)
Lymphs Abs: 1 10*3/uL (ref 0.7–4.0)
MCH: 26.3 pg (ref 26.0–34.0)
Monocytes Absolute: 0.4 10*3/uL (ref 0.1–1.0)
Monocytes Relative: 6 % (ref 3–12)
Neutro Abs: 5.2 10*3/uL (ref 1.7–7.7)
Neutrophils Relative %: 78 % — ABNORMAL HIGH (ref 43–77)
RBC: 4.63 MIL/uL (ref 4.22–5.81)

## 2013-04-12 LAB — BASIC METABOLIC PANEL
CO2: 26 mEq/L (ref 19–32)
Glucose, Bld: 102 mg/dL — ABNORMAL HIGH (ref 70–99)
Potassium: 4 mEq/L (ref 3.5–5.1)
Sodium: 136 mEq/L (ref 135–145)

## 2013-04-12 NOTE — BH Assessment (Signed)
[facility] Assessment Progress Note Called [facility] and gave tele assessment appt time for 1745.
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
## 2013-04-12 NOTE — ED Notes (Signed)
Daughter at bedside.

## 2013-04-12 NOTE — ED Notes (Signed)
Spoke with [facility] will call back with an appointment time.
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
## 2013-04-12 NOTE — ED Notes (Signed)
Discharge instructions reviewed with patient's daughter. Patient left ED in no distress.

## 2016-02-16 ENCOUNTER — Emergency Department (HOSPITAL_COMMUNITY): Payer: Medicare HMO

## 2016-02-16 ENCOUNTER — Encounter (HOSPITAL_COMMUNITY): Payer: Self-pay | Admitting: Emergency Medicine

## 2016-02-16 ENCOUNTER — Emergency Department (HOSPITAL_COMMUNITY)
Admission: EM | Admit: 2016-02-16 | Discharge: 2016-02-16 | Disposition: A | Discharge status: Home / Self Care | Payer: Medicare HMO | Attending: Emergency Medicine | Admitting: Emergency Medicine

## 2016-02-16 DIAGNOSIS — W19XXXA Unspecified fall, initial encounter: Secondary | ICD-10-CM | POA: Insufficient documentation

## 2016-02-16 DIAGNOSIS — I6203 Nontraumatic chronic subdural hemorrhage: Secondary | ICD-10-CM | POA: Diagnosis not present

## 2016-02-16 DIAGNOSIS — Z79899 Other long term (current) drug therapy: Secondary | ICD-10-CM | POA: Diagnosis not present

## 2016-02-16 DIAGNOSIS — J449 Chronic obstructive pulmonary disease, unspecified: Secondary | ICD-10-CM | POA: Insufficient documentation

## 2016-02-16 DIAGNOSIS — Y9289 Other specified places as the place of occurrence of the external cause: Secondary | ICD-10-CM | POA: Diagnosis not present

## 2016-02-16 DIAGNOSIS — S0181XA Laceration without foreign body of other part of head, initial encounter: Secondary | ICD-10-CM

## 2016-02-16 DIAGNOSIS — Y999 Unspecified external cause status: Secondary | ICD-10-CM | POA: Diagnosis not present

## 2016-02-16 DIAGNOSIS — Z87891 Personal history of nicotine dependence: Secondary | ICD-10-CM | POA: Diagnosis not present

## 2016-02-16 DIAGNOSIS — I10 Essential (primary) hypertension: Secondary | ICD-10-CM | POA: Insufficient documentation

## 2016-02-16 DIAGNOSIS — Y939 Activity, unspecified: Secondary | ICD-10-CM | POA: Diagnosis not present

## 2016-02-16 DIAGNOSIS — S0990XA Unspecified injury of head, initial encounter: Secondary | ICD-10-CM | POA: Diagnosis present

## 2016-02-16 HISTORY — DX: Unspecified dementia, unspecified severity, without behavioral disturbance, psychotic disturbance, mood disturbance, and anxiety: F03.90

## 2016-02-16 NOTE — ED Triage Notes (Signed)
[facility] Pt was found at foot of bed, has laceration above right eye and abrasion to right hip. Pt denies pain. No blood thinners. Hx of dementia.

## 2016-02-16 NOTE — ED Notes (Signed)
[facility] called for transport.  

## 2016-02-16 NOTE — ED Notes (Signed)
Bed: WA04 Expected date:  Expected time:  Means of arrival:  Comments: Ems 

## 2016-02-16 NOTE — ED Provider Notes (Signed)
[facility]-EMERGENCY DEPT Provider Note   CSN: [id number] Arrival date & time: 02/16/16  0214  First Provider Contact: 2:39 AM  By signing my name below, I, Vista Mink, attest that this documentation has been prepared under the direction and in the presence of Corbin Falck, MD. Electronically signed, Vista Mink, ED Scribe. 02/16/16. 2:53 AM.   History   Chief Complaint Chief Complaint  Patient presents with  . Fall    HPI LEVEL 5 CAVEAT SECONDARY TO DEMENTIA HPI Comments: Wolf Boulay is a 82 y.o. Male, with PMHx of dementia, brought in by ambulance, who presents to the Emergency Department s/p fall that occurred approximately one hour ago. Per EMS, pt is from [facility] Pt was found at the foot of his bed, has laceration above right eye; bleeding controlled. Pt currently alert but not oriented. No blood thinners.    The history is provided by the patient. No language interpreter was used.  Fall  This is a new problem. The current episode started 1 to 2 hours ago. The problem occurs constantly. The problem has not changed since onset.Pertinent negatives include no chest pain. Nothing aggravates the symptoms. Nothing relieves the symptoms. He has tried nothing for the symptoms. The treatment provided no relief.     Past Medical History:  Diagnosis Date  . Anemia   . COPD (chronic obstructive pulmonary disease) (HCC)   . Diverticulitis   . Glaucoma   . Hypertension   . Lung mass     Patient Active Problem List   Diagnosis Date Noted  . Acute cholecystitis 05/26/2011  . RUQ abdominal pain 05/26/2011  . Fever 05/26/2011  . Jaundice 05/26/2011  . Leukocytosis 05/26/2011  . Hypokalemia 05/26/2011  . Anemia 05/26/2011    Past Surgical History:  Procedure Laterality Date  . CHOLECYSTECTOMY  05/30/2011   Procedure: LAPAROSCOPIC CHOLECYSTECTOMY WITH INTRAOPERATIVE CHOLANGIOGRAM;  Surgeon: Clovis Pu. Cornett, MD;  Location: [facility] OR;  Service: General;   Laterality: N/A;  . ERCP  05/28/2011   Procedure: ENDOSCOPIC RETROGRADE CHOLANGIOPANCREATOGRAPHY (ERCP);  Surgeon: Theda Belfast;  Location: [facility] ENDOSCOPY;  Service: Endoscopy;  Laterality: N/A;  . ERCP  05/31/2011   Procedure: ENDOSCOPIC RETROGRADE CHOLANGIOPANCREATOGRAPHY (ERCP);  Surgeon: Eliezer Bottom., MD,FACG;  Location: [facility] ENDOSCOPY;  Service: Endoscopy;  Laterality: N/A;  . SUBTOTAL COLECTOMY    . TONSILLECTOMY         Home Medications    Prior to Admission medications   Medication Sig Start Date End Date Taking? Authorizing Provider  ASPIRIN PO Take 1 tablet by mouth daily as needed (for headache).    Historical Provider, MD    Family History Family History  Problem Relation Age of Onset  . Bone cancer Brother     Social History Social History  Substance Use Topics  . Smoking status: Former Games developer  . Smokeless tobacco: Not on file  . Alcohol use No     Allergies   Review of patient's allergies indicates no known allergies.   Review of Systems Review of Systems  Unable to perform ROS: Dementia  Cardiovascular: Negative for chest pain.     Physical Exam Updated Vital Signs BP 151/74 (BP Location: Left Arm)   Pulse 84   Temp 98.6 F (37 C) (Oral)   Resp 18   SpO2 100%    Results for orders placed or performed during the hospital encounter of 04/12/13  CBC with Differential  Result Value Ref Range   WBC 6.6 4.0 - 10.5  K/uL   RBC 4.63 4.22 - 5.81 MIL/uL   Hemoglobin 12.2 (L) 13.0 - 17.0 g/dL   HCT 40.936.6 (L) 81.139.0 - 91.452.0 %   MCV 79.0 78.0 - 100.0 fL   MCH 26.3 26.0 - 34.0 pg   MCHC 33.3 30.0 - 36.0 g/dL   RDW 78.217.4 (H) 95.611.5 - 21.315.5 %   Platelets 107 (L) 150 - 400 K/uL   Neutrophils Relative % 78 (H) 43 - 77 %   Neutro Abs 5.2 1.7 - 7.7 K/uL   Lymphocytes Relative 15 12 - 46 %   Lymphs Abs 1.0 0.7 - 4.0 K/uL   Monocytes Relative 6 3 - 12 %   Monocytes Absolute 0.4 0.1 - 1.0 K/uL   Eosinophils Relative 1 0 - 5 %   Eosinophils Absolute 0.0 0.0  - 0.7 K/uL   Basophils Relative 0 0 - 1 %   Basophils Absolute 0.0 0.0 - 0.1 K/uL   Smear Review SPECIMEN CHECKED FOR CLOTS   Basic metabolic panel  Result Value Ref Range   Sodium 136 135 - 145 mEq/L   Potassium 4.0 3.5 - 5.1 mEq/L   Chloride 101 96 - 112 mEq/L   CO2 26 19 - 32 mEq/L   Glucose, Bld 102 (H) 70 - 99 mg/dL   BUN 18 6 - 23 mg/dL   Creatinine, Ser 0.861.36 (H) 0.50 - 1.35 mg/dL   Calcium 9.5 8.4 - 57.810.5 mg/dL   GFR calc non Af Amer 44 (L) >90 mL/min   GFR calc Af Amer 51 (L) >90 mL/min  Drug screen panel, emergency  Result Value Ref Range   Opiates NONE DETECTED NONE DETECTED   Cocaine NONE DETECTED NONE DETECTED   Benzodiazepines NONE DETECTED NONE DETECTED   Amphetamines NONE DETECTED NONE DETECTED   Tetrahydrocannabinol NONE DETECTED NONE DETECTED   Barbiturates NONE DETECTED NONE DETECTED  Ethanol  Result Value Ref Range   Alcohol, Ethyl (B) <11 0 - 11 mg/dL   No results found.  Physical Exam  Constitutional: He appears well-developed and well-nourished.  HENT:  Head: Normocephalic. Head is without raccoon's eyes and without Battle's sign.  Right Ear: No hemotympanum.  Left Ear: No hemotympanum.  Mouth/Throat: Oropharynx is clear and moist. No oropharyngeal exudate.  Head bandaged  1.5 cm laceration above right eye  Eyes: Conjunctivae and EOM are normal. Pupils are equal, round, and reactive to light. Right eye exhibits no discharge. Left eye exhibits no discharge. No scleral icterus.  Neck: Normal range of motion. Neck supple. No JVD present. No tracheal deviation present.  Trachea is midline. No stridor or carotid bruits. Wearing C-collar  Cardiovascular: Normal rate, regular rhythm, normal heart sounds and intact distal pulses.   No murmur heard. 2+ dorsalis pedis  Pulmonary/Chest: Effort normal and breath sounds normal. No stridor. No respiratory distress. He has no wheezes. He has no rales.  Lungs CTA bilaterally.   Abdominal: Soft. Bowel sounds are  normal. He exhibits no distension. There is no tenderness. There is no rebound and no guarding.  Musculoskeletal:  FROM on bilateral lower extremities. Pelvis stable; no crepitus.  Lymphadenopathy:    He has no cervical adenopathy.  Neurological: He is alert. He has normal reflexes.  Skin: Skin is warm.  Psychiatric: He has a normal mood and affect. His behavior is normal.  Nursing note and vitals reviewed.    ED Treatments / Results   Vitals:   02/16/16 0222  BP: 151/74  Pulse: 84  Resp: 18  Temp: 98.6 F (37 C)   Results for orders placed or performed during the hospital encounter of 04/12/13  CBC with Differential  Result Value Ref Range   WBC 6.6 4.0 - 10.5 K/uL   RBC 4.63 4.22 - 5.81 MIL/uL   Hemoglobin 12.2 (L) 13.0 - 17.0 g/dL   HCT 09.8 (L) 11.9 - 14.7 %   MCV 79.0 78.0 - 100.0 fL   MCH 26.3 26.0 - 34.0 pg   MCHC 33.3 30.0 - 36.0 g/dL   RDW 82.9 (H) 56.2 - 13.0 %   Platelets 107 (L) 150 - 400 K/uL   Neutrophils Relative % 78 (H) 43 - 77 %   Neutro Abs 5.2 1.7 - 7.7 K/uL   Lymphocytes Relative 15 12 - 46 %   Lymphs Abs 1.0 0.7 - 4.0 K/uL   Monocytes Relative 6 3 - 12 %   Monocytes Absolute 0.4 0.1 - 1.0 K/uL   Eosinophils Relative 1 0 - 5 %   Eosinophils Absolute 0.0 0.0 - 0.7 K/uL   Basophils Relative 0 0 - 1 %   Basophils Absolute 0.0 0.0 - 0.1 K/uL   Smear Review SPECIMEN CHECKED FOR CLOTS   Basic metabolic panel  Result Value Ref Range   Sodium 136 135 - 145 mEq/L   Potassium 4.0 3.5 - 5.1 mEq/L   Chloride 101 96 - 112 mEq/L   CO2 26 19 - 32 mEq/L   Glucose, Bld 102 (H) 70 - 99 mg/dL   BUN 18 6 - 23 mg/dL   Creatinine, Ser 8.65 (H) 0.50 - 1.35 mg/dL   Calcium 9.5 8.4 - 78.4 mg/dL   GFR calc non Af Amer 44 (L) >90 mL/min   GFR calc Af Amer 51 (L) >90 mL/min  Drug screen panel, emergency  Result Value Ref Range   Opiates NONE DETECTED NONE DETECTED   Cocaine NONE DETECTED NONE DETECTED   Benzodiazepines NONE DETECTED NONE DETECTED   Amphetamines  NONE DETECTED NONE DETECTED   Tetrahydrocannabinol NONE DETECTED NONE DETECTED   Barbiturates NONE DETECTED NONE DETECTED  Ethanol  Result Value Ref Range   Alcohol, Ethyl (B) <11 0 - 11 mg/dL   No results found.   Labs (all labs ordered are listed, but only abnormal results are displayed) Labs Reviewed - No data to display  EKG  EKG Interpretation None       Radiology No results found.  Procedures .Marland KitchenLaceration Repair Date/Time: 02/16/2016 5:30 AM Performed by: Cy Blamer Authorized by: Cy Blamer   Consent:    Consent obtained:  Emergent situation Anesthesia (see MAR for exact dosages):    Anesthesia method:  None Laceration details:    Location:  Face   Face location:  Forehead   Length (cm):  1   Depth (mm):  2 Repair type:    Repair type:  Simple Pre-procedure details:    Preparation:  Patient was prepped and draped in usual sterile fashion Exploration:    Hemostasis achieved with:  Direct pressure   Wound exploration: entire depth of wound probed and visualized     Wound extent: no nerve damage noted and no tendon damage noted     Contaminated: no   Treatment:    Area cleansed with:  Saline and Betadine   Amount of cleaning:  Standard Skin repair:    Repair method:  Tissue adhesive Approximation:    Approximation:  Close   Vermilion border: well-aligned   Post-procedure details:    Dressing:  Sterile  dressing   Patient tolerance of procedure:  Tolerated well, no immediate complications   (including critical care time)  Medications Ordered in ED Medications - No data to display   Initial Impression / Assessment and Plan / ED Course  I have reviewed the triage vital signs and the nursing notes.  Pertinent labs & imaging results that were available during my care of the patient were reviewed by me and considered in my medical decision making (see chart for details).  Clinical Course    Patient has a different birth date in our system  that is one year earlier.  Per nurse report family knows about subdurals.    Final Clinical Impressions(s) / ED Diagnoses   Final diagnoses:  None    New Prescriptions New Prescriptions   No medications on file  I personally performed the services described in this documentation, which was scribed in my presence. The recorded information has been reviewed and is accurate.        Cy Blamer, MD 02/16/16 [phone]

## 2016-03-10 DEATH — deceased
